# Patient Record
Sex: Female | Born: 1960 | Race: White | Hispanic: No | Marital: Married | Smoking: Former smoker
Health system: Southern US, Community
[De-identification: ages and names within clinical notes are randomized; demographics above are authoritative.]

## PROBLEM LIST (undated history)

## (undated) HISTORY — PX: TUBAL LIGATION: SHX77

---

## 2012-05-30 ENCOUNTER — Ambulatory Visit: Payer: Self-pay | Admitting: Family Medicine

## 2012-05-30 LAB — HM PAP SMEAR

## 2013-05-23 ENCOUNTER — Ambulatory Visit: Payer: Self-pay | Admitting: Family Medicine

## 2013-05-29 ENCOUNTER — Ambulatory Visit: Payer: Self-pay | Admitting: Family Medicine

## 2013-11-19 ENCOUNTER — Ambulatory Visit: Payer: Self-pay | Admitting: Family Medicine

## 2014-05-29 ENCOUNTER — Ambulatory Visit: Payer: Self-pay | Admitting: Gastroenterology

## 2014-05-29 LAB — HM COLONOSCOPY

## 2014-09-05 IMAGING — CT CT CHEST W/O CM
2 of 3 series · 15 of 36 positions shown, 18 images · non-contrast
Comparison: 05/29/2013

CLINICAL DATA: Followup of pulmonary lesions.

EXAM:
CT CHEST WITHOUT CONTRAST
TECHNIQUE: Multidetector CT imaging of the chest was performed following the
standard protocol without IV contrast.

[Series 2: routine chest wo · axial · 0.67mm/px · z∈[-550,-300]mm · 12 of 60 slices shown, 15 images]
[im 5/60  mediastinal]
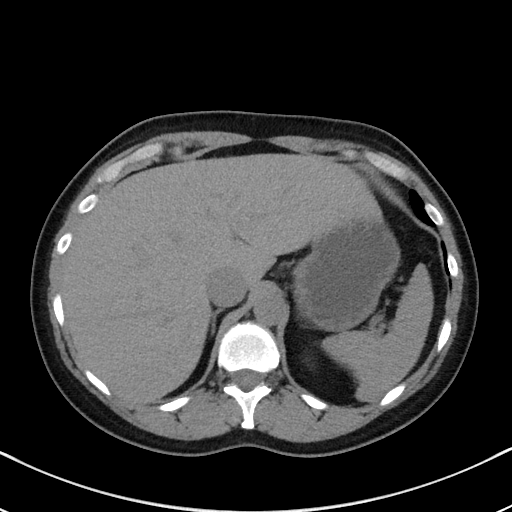
[im 5/60  lung]
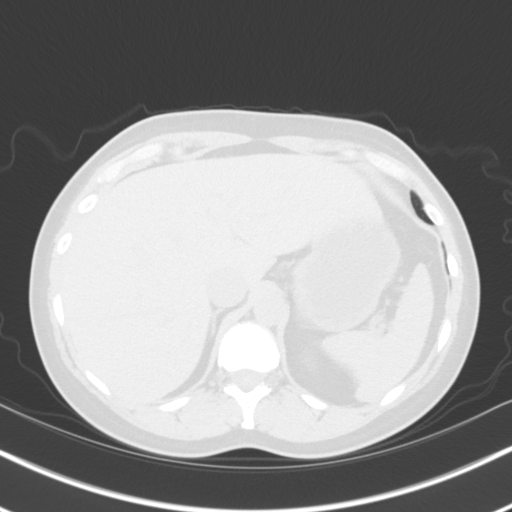
[im 9/60  lung]
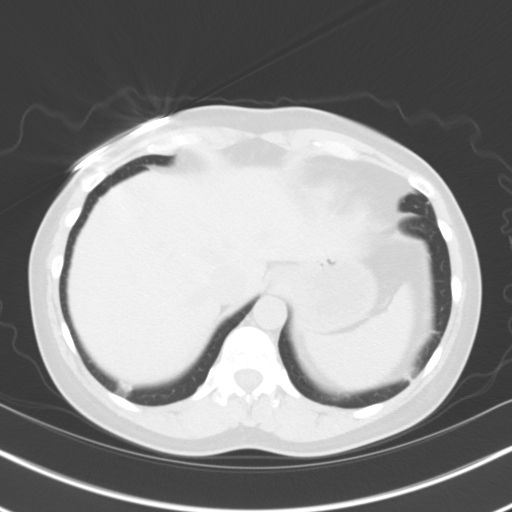
[im 14/60  lung]
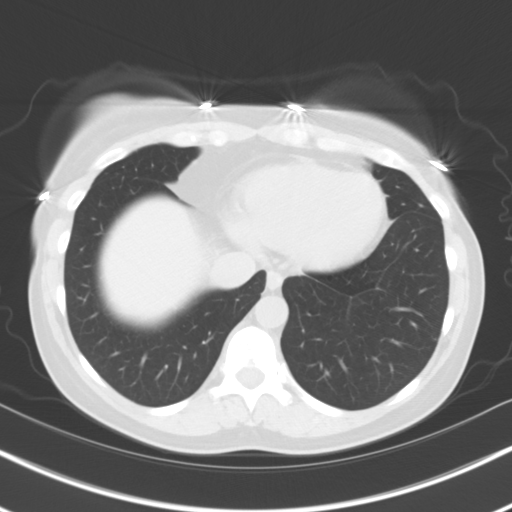
[im 18/60  lung]
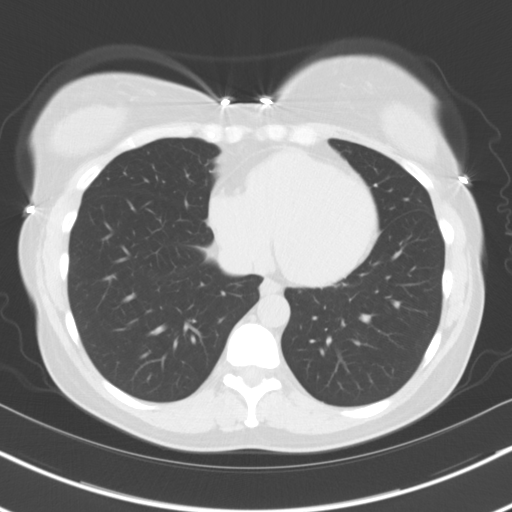
[im 22/60  mediastinal]
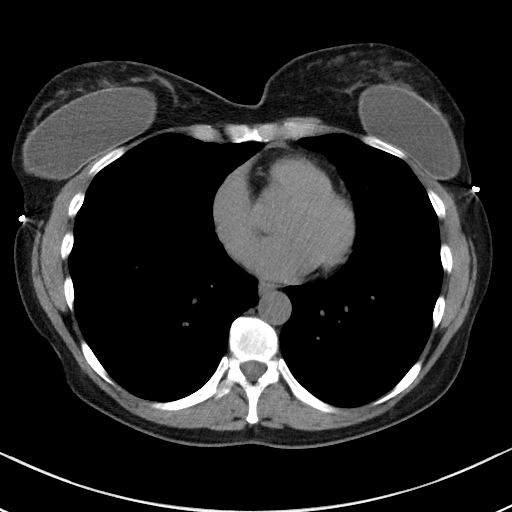
[im 22/60  lung]
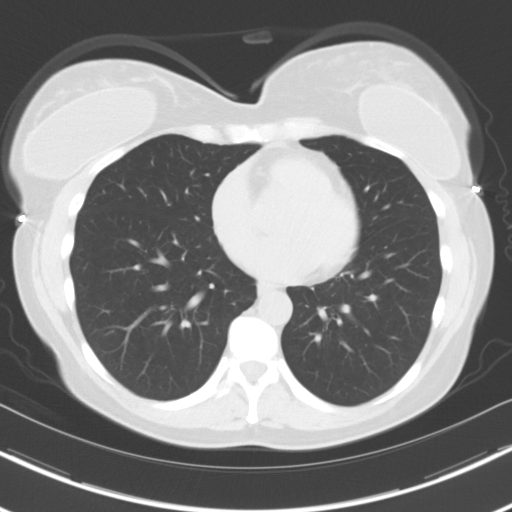
[im 27/60  lung]
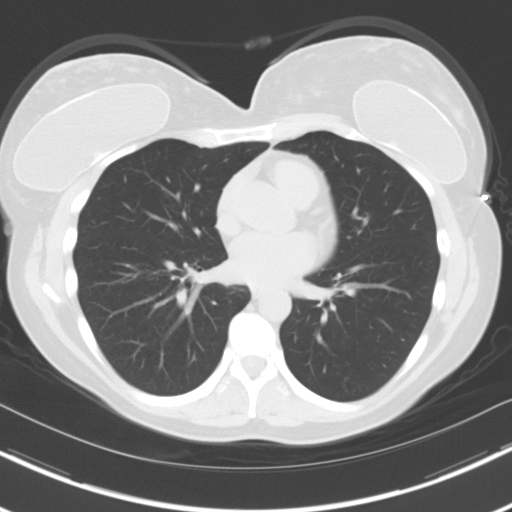
[im 33/60  lung]
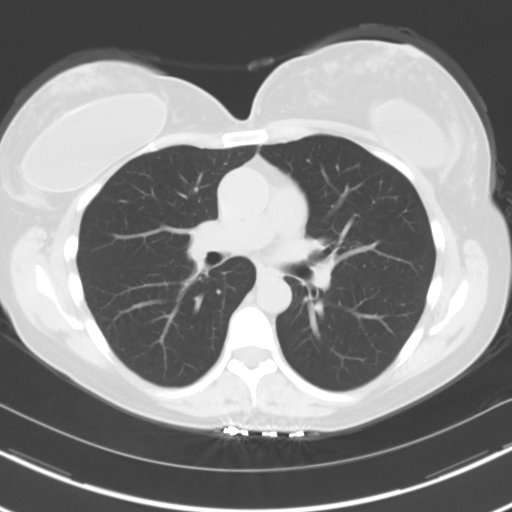
[im 38/60  lung]
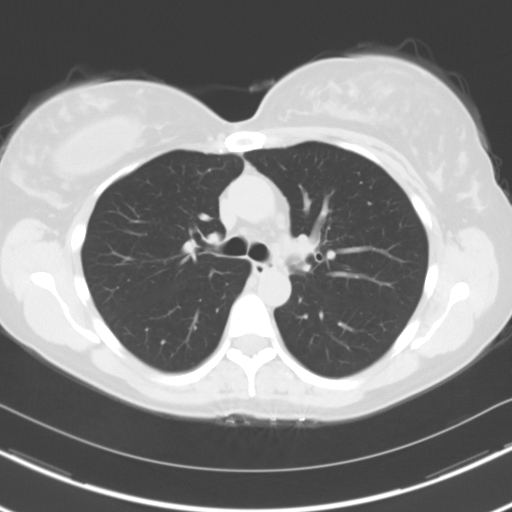
[im 42/60  mediastinal]
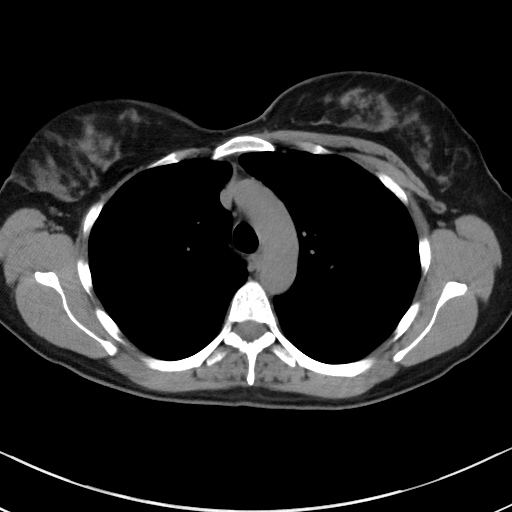
[im 42/60  lung]
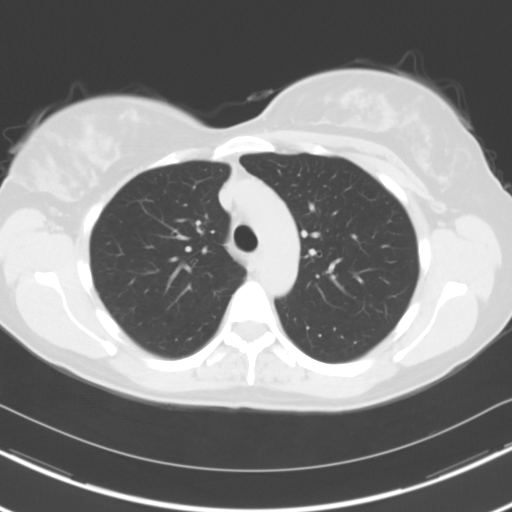
[im 46/60  lung]
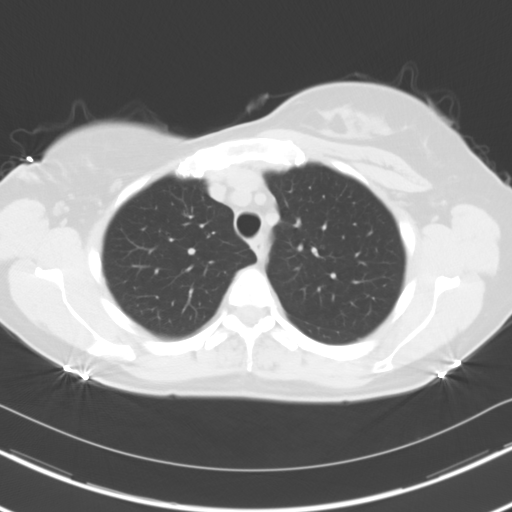
[im 51/60  lung]
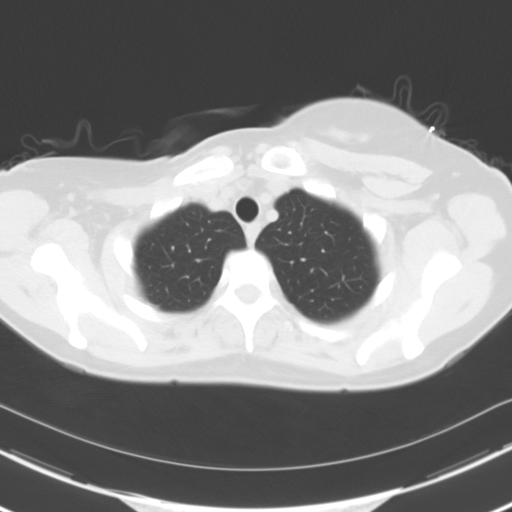
[im 55/60  lung]
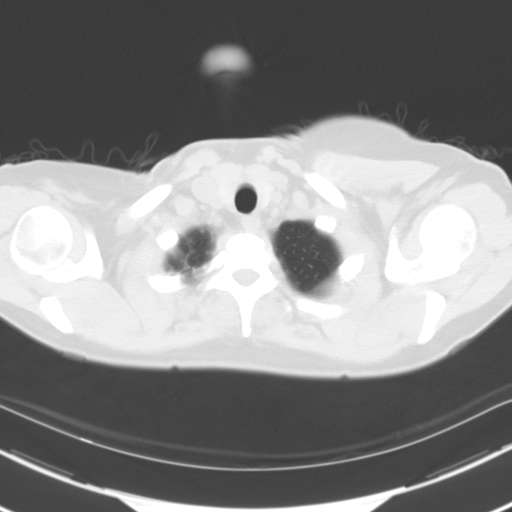

[Series 5: cor routine chest wo · coronal · 0.59mm/px · 3 of 144 slices shown]
[im 29/144  lung]
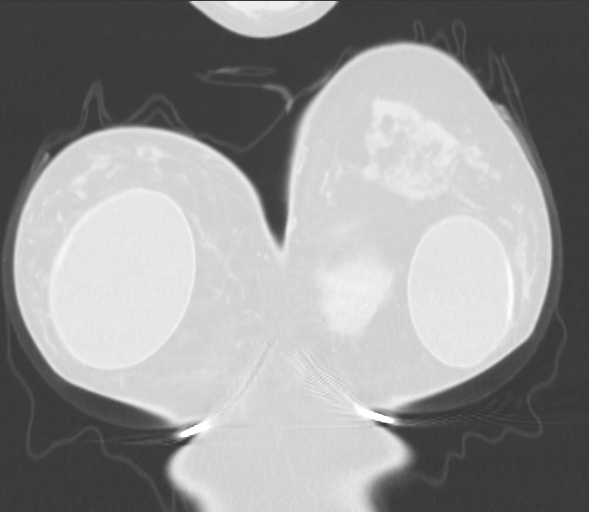
[im 58/144  lung]
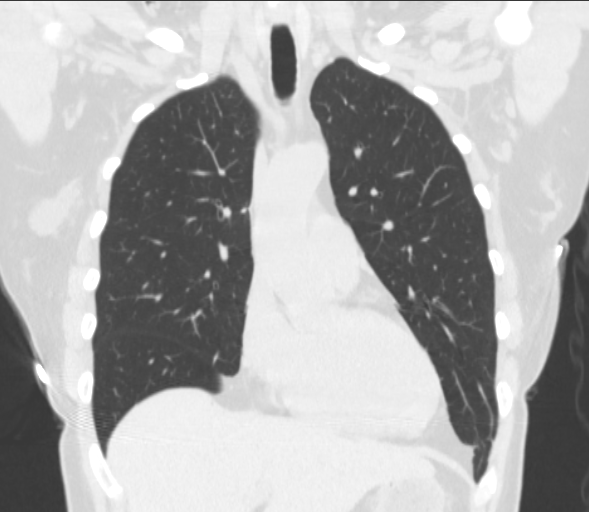
[im 86/144  lung]
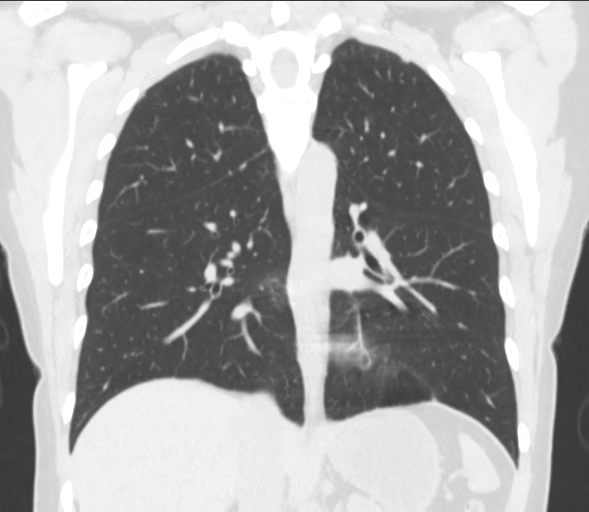

[15 of 36 positions shown; findings below may reference images not displayed]

FINDINGS: Lungs/Pleura: Resolution of the previously described right lower
lobe lung nodule. Minimal scarring remains at this site on image
53/series 4.

The left lung base nodule has also resolved with minimal scarring
remaining, including on image 52/series 4.

No new or enlarging nodules.

No pleural fluid.

Heart/Mediastinum: Bilateral breast implants. Heart size upper
normal, without pericardial effusion. No mediastinal or definite
hilar adenopathy, given limitations of unenhanced CT.

Upper Abdomen:  No significant findings.

Bones/Musculoskeletal:  No acute osseous abnormality.
IMPRESSION: 1. Interval resolution of bibasilar pulmonary opacities, which were
likely infectious or inflammatory.
2.  No acute process in the chest.

## 2015-04-14 ENCOUNTER — Telehealth: Payer: Self-pay

## 2015-04-14 NOTE — Telephone Encounter (Signed)
Patient called wanted to know the results for her cholesterol and last wellness exam. Informed patient last wellness was 0/18/15 also wanted to know las BP and last glucose and cholesterol. Patient is aware that is due for a wellness. Per patient prefers a female and will call to schedule.  Thanks,  -Kristi Shepherd

## 2015-06-30 ENCOUNTER — Encounter: Payer: Self-pay | Admitting: Family Medicine

## 2015-08-03 ENCOUNTER — Telehealth: Payer: Self-pay | Admitting: Family Medicine

## 2015-08-03 NOTE — Telephone Encounter (Signed)
Will need examination of sinuses before prescribing an antibiotic. May try Mucinex-D or Allegra-D with Flonase nasal spray (OTC medications) for symptom and inflammation relief, if she can't see someone here or in an Urgent Care Clinic.

## 2015-08-03 NOTE — Telephone Encounter (Signed)
Pt thinks she has a sinus infection.  Does not want to come in.  Hasn't been in since March 2016.  She wants an antibiotic called in.  Her call back is   [phone]  She will not be available from 2 to 3. Pm  Thanks Barth Kirkseri

## 2015-08-03 NOTE — Telephone Encounter (Signed)
Unable to contact patient. Before I call again, would you be willing to send in Rx for a sinus infection? Please advise. Thanks!

## 2015-08-06 NOTE — Telephone Encounter (Signed)
Unable to contact patient. Phone kept ringing. Will try again later.

## 2015-08-09 NOTE — Telephone Encounter (Signed)
Attempted to contact patient. No answer nor voicemail.  

## 2016-06-05 ENCOUNTER — Encounter: Payer: Self-pay | Admitting: Physician Assistant

## 2016-06-05 ENCOUNTER — Ambulatory Visit (INDEPENDENT_AMBULATORY_CARE_PROVIDER_SITE_OTHER): Payer: BC Managed Care – PPO | Admitting: Physician Assistant

## 2016-06-05 ENCOUNTER — Other Ambulatory Visit: Payer: Self-pay

## 2016-06-05 VITALS — BP 118/70 | HR 71 | Temp 98.1°F | Resp 16 | Wt 149.8 lb

## 2016-06-05 DIAGNOSIS — Z9882 Breast implant status: Secondary | ICD-10-CM | POA: Insufficient documentation

## 2016-06-05 DIAGNOSIS — F419 Anxiety disorder, unspecified: Secondary | ICD-10-CM | POA: Insufficient documentation

## 2016-06-05 DIAGNOSIS — Z780 Asymptomatic menopausal state: Secondary | ICD-10-CM | POA: Insufficient documentation

## 2016-06-05 DIAGNOSIS — J019 Acute sinusitis, unspecified: Secondary | ICD-10-CM | POA: Diagnosis not present

## 2016-06-05 DIAGNOSIS — F439 Reaction to severe stress, unspecified: Secondary | ICD-10-CM | POA: Insufficient documentation

## 2016-06-05 DIAGNOSIS — R918 Other nonspecific abnormal finding of lung field: Secondary | ICD-10-CM | POA: Insufficient documentation

## 2016-06-05 DIAGNOSIS — N83209 Unspecified ovarian cyst, unspecified side: Secondary | ICD-10-CM | POA: Insufficient documentation

## 2016-06-05 DIAGNOSIS — IMO0002 Reserved for concepts with insufficient information to code with codable children: Secondary | ICD-10-CM | POA: Insufficient documentation

## 2016-06-05 DIAGNOSIS — G4700 Insomnia, unspecified: Secondary | ICD-10-CM | POA: Insufficient documentation

## 2016-06-05 DIAGNOSIS — Z87440 Personal history of urinary (tract) infections: Secondary | ICD-10-CM | POA: Insufficient documentation

## 2016-06-05 MED ORDER — AMOXICILLIN-POT CLAVULANATE 875-125 MG PO TABS: 1.0000 | ORAL_TABLET | Freq: Two times a day (BID) | ORAL | 0 refills | Status: DC

## 2016-06-05 NOTE — Progress Notes (Signed)
Patient: Kristi AdamSharon H Shepherd Female    DOB: Oct 26, 1960   55 y.o.   MRN: [id number] Visit Date: 06/05/2016  Today's Provider: Trey SailorsAdriana M Pollak, PA-C   Chief Complaint  Patient presents with  . Sinusitis   Subjective:    Sinusitis  This is a new problem. The current episode started 1 to 4 weeks ago. The problem has been gradually worsening since onset. There has been no fever. Associated symptoms include congestion, coughing, headaches and sinus pressure. Pertinent negatives include no shortness of breath or sore throat. (Phlegm-green to yellowish) Past treatments include oral decongestants (Advil Cold Sinus last week). The treatment provided no relief.   Total length of symptoms is 10 days. Felt like she was getting better then got worse. Advil cold sinus provided some relief. Patient has had these symptoms before.    Allergies  Allergen Reactions  . Codeine     Other reaction(s): Itching     Current Outpatient Prescriptions:  .  JUNEL FE 1/20 1-20 MG-MCG tablet, , Disp: , Rfl:  .  MULTIPLE VITAMIN PO, Take by mouth., Disp: , Rfl:   Review of Systems  HENT: Positive for congestion and sinus pressure. Negative for sore throat.   Respiratory: Positive for cough. Negative for shortness of breath.   Neurological: Positive for headaches.    Social History  Substance Use Topics  . Smoking status: Former Games developermoker  . Smokeless tobacco: Never Used     Comment: Quit at age 55  . Alcohol use Yes     Comment: Ocassional alcohol use   Objective:   BP 118/70 (BP Location: Left Arm, Patient Position: Sitting, Cuff Size: Normal)   Pulse 71   Temp 98.1 F (36.7 C) (Oral)   Resp 16   Wt 149 lb 12.8 oz (67.9 kg)   LMP 01/22/2016   SpO2 98%   Physical Exam  Constitutional: She is oriented to person, place, and time. She appears well-developed and well-nourished. No distress.  HENT:  Right Ear: External ear normal.  Left Ear: External ear normal.  Nose: Right sinus exhibits  no maxillary sinus tenderness and no frontal sinus tenderness. Left sinus exhibits no maxillary sinus tenderness and no frontal sinus tenderness.  Mouth/Throat: Oropharynx is clear and moist. No oropharyngeal exudate, posterior oropharyngeal edema or posterior oropharyngeal erythema.  Tms opaque bilaterally   Eyes: Conjunctivae are normal. Right eye exhibits no discharge. Left eye exhibits no discharge.  Neck: Neck supple.  Cardiovascular: Normal rate and regular rhythm.   Pulmonary/Chest: Effort normal and breath sounds normal.  Lymphadenopathy:    She has cervical adenopathy.  Neurological: She is alert and oriented to person, place, and time.  Skin: Skin is warm and dry. She is not diaphoretic.  Psychiatric: She has a normal mood and affect. Her behavior is normal.        Assessment & Plan:      Problem List Items Addressed This Visit    None    Visit Diagnoses    Acute non-recurrent sinusitis, unspecified location    -  Primary   Relevant Medications   amoxicillin-clavulanate (AUGMENTIN) 875-125 MG tablet     Patient is 55 y/o female presenting with sinusitis. Will treat as above. Patient will call back if not feeling better.    Patient Instructions  Sinusitis, Adult Sinusitis is redness, soreness, and inflammation of the paranasal sinuses. Paranasal sinuses are air pockets within the bones of your face. They are located beneath your eyes,  in the middle of your forehead, and above your eyes. In healthy paranasal sinuses, mucus is able to drain out, and air is able to circulate through them by way of your nose. However, when your paranasal sinuses are inflamed, mucus and air can become trapped. This can allow bacteria and other germs to grow and cause infection. Sinusitis can develop quickly and last only a short time (acute) or continue over a long period (chronic). Sinusitis that lasts for more than 12 weeks is considered chronic. CAUSES Causes of sinusitis  include:  Allergies.  Structural abnormalities, such as displacement of the cartilage that separates your nostrils (deviated septum), which can decrease the air flow through your nose and sinuses and affect sinus drainage.  Functional abnormalities, such as when the small hairs (cilia) that line your sinuses and help remove mucus do not work properly or are not present. SIGNS AND SYMPTOMS Symptoms of acute and chronic sinusitis are the same. The primary symptoms are pain and pressure around the affected sinuses. Other symptoms include:  Upper toothache.  Earache.  Headache.  Bad breath.  Decreased sense of smell and taste.  A cough, which worsens when you are lying flat.  Fatigue.  Fever.  Thick drainage from your nose, which often is green and may contain pus (purulent).  Swelling and warmth over the affected sinuses. DIAGNOSIS Your health care provider will perform a physical exam. During your exam, your health care provider may perform any of the following to help determine if you have acute sinusitis or chronic sinusitis:  Look in your nose for signs of abnormal growths in your nostrils (nasal polyps).  Tap over the affected sinus to check for signs of infection.  View the inside of your sinuses using an imaging device that has a light attached (endoscope). If your health care provider suspects that you have chronic sinusitis, one or more of the following tests may be recommended:  Allergy tests.  Nasal culture. A sample of mucus is taken from your nose, sent to a lab, and screened for bacteria.  Nasal cytology. A sample of mucus is taken from your nose and examined by your health care provider to determine if your sinusitis is related to an allergy. TREATMENT Most cases of acute sinusitis are related to a viral infection and will resolve on their own within 10 days. Sometimes, medicines are prescribed to help relieve symptoms of both acute and chronic sinusitis.  These may include pain medicines, decongestants, nasal steroid sprays, or saline sprays. However, for sinusitis related to a bacterial infection, your health care provider will prescribe antibiotic medicines. These are medicines that will help kill the bacteria causing the infection. Rarely, sinusitis is caused by a fungal infection. In these cases, your health care provider will prescribe antifungal medicine. For some cases of chronic sinusitis, surgery is needed. Generally, these are cases in which sinusitis recurs more than 3 times per year, despite other treatments. HOME CARE INSTRUCTIONS  Drink plenty of water. Water helps thin the mucus so your sinuses can drain more easily.  Use a humidifier.  Inhale steam 3-4 times a day (for example, sit in the bathroom with the shower running).  Apply a warm, moist washcloth to your face 3-4 times a day, or as directed by your health care provider.  Use saline nasal sprays to help moisten and clean your sinuses.  Take medicines only as directed by your health care provider.  If you were prescribed either an antibiotic or antifungal medicine, finish  it all even if you start to feel better. SEEK IMMEDIATE MEDICAL CARE IF:  You have increasing pain or severe headaches.  You have nausea, vomiting, or drowsiness.  You have swelling around your face.  You have vision problems.  You have a stiff neck.  You have difficulty breathing.   This information is not intended to replace advice given to you by your health care provider. Make sure you discuss any questions you have with your health care provider.   Document Released: 07/10/2005 Document Revised: 07/31/2014 Document Reviewed: 07/25/2011 Elsevier Interactive Patient Education 2016 [employer].    No Follow-up on file.  The entirety of the information documented in the History of Present Illness, Review of Systems and Physical Exam were personally obtained by me. Portions of this  information were initially documented by Miracle Mongillo and reviewed by me for thoroughness and accuracy.            Trey Sailors, PA-C  [facility]

## 2016-06-05 NOTE — Patient Instructions (Signed)

## 2016-06-14 ENCOUNTER — Ambulatory Visit (INDEPENDENT_AMBULATORY_CARE_PROVIDER_SITE_OTHER): Payer: BC Managed Care – PPO | Admitting: Physician Assistant

## 2016-06-14 ENCOUNTER — Encounter: Payer: Self-pay | Admitting: Physician Assistant

## 2016-06-14 VITALS — BP 122/72 | HR 62 | Temp 98.1°F | Resp 16 | Ht 65.0 in | Wt 150.0 lb

## 2016-06-14 DIAGNOSIS — Z0000 Encounter for general adult medical examination without abnormal findings: Secondary | ICD-10-CM | POA: Diagnosis not present

## 2016-06-14 DIAGNOSIS — R918 Other nonspecific abnormal finding of lung field: Secondary | ICD-10-CM | POA: Diagnosis not present

## 2016-06-14 NOTE — Patient Instructions (Signed)

## 2016-06-14 NOTE — Progress Notes (Signed)
Patient: Kristi Shepherd, Female    DOB: [birth year], 55 y.o.   MRN: [id number] Visit Date: 06/14/2016  Today's Provider: Trinna Post, PA-C   Chief Complaint  Patient presents with  . Annual Exam   Subjective:    Annual physical exam Kristi Shepherd is a 55 y.o. female who presents today for health maintenance and complete physical. She feels well. She reports exercising about 10 minutes a day, walking. She reports she is sleeping poorly. She was seen in clinic on 06/05/2016 for sinusitis and is finishing a course of Augmentin. She reports feeling much better.   She haas worked at [employer] for the past 30 years. She is retiring this December and is planning on working part time closer to home in [place].  She is married and has three children, a son who is 68, a daughter who is 14, and her younger son who is 109. She had a tubal ligation after her youngest child. Her youngest son has special needs and is starting [place] here at [facility], so this is another reason she would like to be closer to home.  She sees Dr. Hester Mates [facility] 2/2 a history of HPV+ in 2013 and irregular vaginal bleeding. She reports her most recent Pap/HPV 05/24/2016 were both negative. She takes Junel for her irregular bleeding/HRT and is seen regularly by her OBGYN for this. She reports her gynecologist performs her breast exam and rectal exam. She gets her mammograms at [facility] and reports she had one in 2016.   She has a history of smoking, but quit at 89. She did demonstrate some lung nodules on a 2014 CT scan that resolved on follow up Ct.  Her last colonoscopy was in 2015 with Dr. Allen Norris. It showed internal hemorrhoids but was otherwise normal. She is due for her next colonoscopy in 2025, pending no new symptoms.  She denies any family history of colon cancer, breast cancer, lung cancer. Her father does currently have CLL and her mother has thyroid  problems.  ----------------------------------------------------------------- Colonoscopy- 05/29/14 internal hemorrhoids, repeat 10 years Pap- 05/30/12 normal, HPV positive refer to GYN Mammogram- 05/28/14- normal has appt for another one already.  Immunization History  Administered Date(s) Administered  . Tdap 04/14/2008     Review of Systems  Constitutional: Negative.   HENT: Negative.   Eyes: Negative.   Respiratory: Negative.   Cardiovascular: Negative.   Gastrointestinal: Negative.   Endocrine: Negative.   Genitourinary: Negative.   Musculoskeletal: Negative.   Skin: Negative.   Allergic/Immunologic: Negative.   Neurological: Negative.   Hematological: Negative.   Psychiatric/Behavioral: Negative.     Social History      She  reports that she has quit smoking. She has never used smokeless tobacco. She reports that she drinks alcohol. She reports that she does not use drugs.       Social History   Social History  . Marital status: Married    Spouse name: N/A  . Number of children: N/A  . Years of education: N/A   Social History Main Topics  . Smoking status: Former Research scientist (life sciences)  . Smokeless tobacco: Never Used     Comment: Quit at age 59  . Alcohol use Yes     Comment: Ocassional alcohol use  . Drug use: No  . Sexual activity: Not Asked   Other Topics Concern  . None   Social History Narrative  . None  History reviewed. No pertinent past medical history.   Patient Active Problem List   Diagnosis Date Noted  . Anxiety 06/05/2016  . Coitalgia 06/05/2016  . Status post bilateral breast implants 06/05/2016  . History of urinary tract infection 06/05/2016  . Cannot sleep 06/05/2016  . Asymptomatic postmenopausal status 06/05/2016  . Cyst of ovary 06/05/2016  . Lung nodule, multiple 06/05/2016  . Feeling stressed out 06/05/2016    Past Surgical History:  Procedure Laterality Date  . TUBAL LIGATION      Family History        Family Status   Relation Status  . Mother Alive  . Father Alive  . Maternal Grandmother Deceased   died from old age  . Maternal Grandfather Deceased   cause of death Alzheimers  . Paternal Grandmother Deceased at age 32   Brain Tumot  . Paternal Grandfather Deceased        Her family history includes Leukemia in her father; Thyroid disease in her mother.     Allergies  Allergen Reactions  . Codeine     Other reaction(s): Itching     Current Outpatient Prescriptions:  Marland Kitchen  MULTIPLE VITAMIN PO, Take by mouth., Disp: , Rfl:  .  JUNEL FE 1/20 1-20 MG-MCG tablet, , Disp: , Rfl:    Patient Care Team: Trinna Post, PA-C as PCP - General (Physician Assistant)      Objective:   Vitals: BP 122/72 (BP Location: Left Arm, Patient Position: Sitting, Cuff Size: Normal)   Pulse 62   Temp 98.1 F (36.7 C) (Oral)   Resp 16   Ht 5' 5" (1.651 m)   Wt 150 lb (68 kg)   LMP 01/22/2016   BMI 24.96 kg/m    Physical Exam  Constitutional: She is oriented to person, place, and time. She appears well-developed and well-nourished. No distress.  HENT:  Right Ear: External ear normal.  Left Ear: External ear normal.  Mouth/Throat: Oropharynx is clear and moist. No oropharyngeal exudate.  Eyes: Conjunctivae are normal. Pupils are equal, round, and reactive to light. Right eye exhibits no discharge. Left eye exhibits no discharge.  Neck: Neck supple. No thyromegaly present.  Cardiovascular: Normal rate, regular rhythm and normal heart sounds.   Pulmonary/Chest: Effort normal and breath sounds normal.  Abdominal: Soft. Bowel sounds are normal.  Lymphadenopathy:    She has no cervical adenopathy.  Neurological: She is alert and oriented to person, place, and time.  Skin: Skin is warm and dry. She is not diaphoretic.  Psychiatric: She has a normal mood and affect. Her behavior is normal.     Depression Screen No flowsheet data found.    Assessment & Plan:     Routine Health Maintenance and  Physical Exam  Exercise Activities and Dietary recommendations Goals    None      Immunization History  Administered Date(s) Administered  . Tdap 04/14/2008    Health Maintenance  Topic Date Due  . Hepatitis C Screening  September 09, 1960  . HIV Screening  06/03/1977  . TETANUS/TDAP  06/03/1981  . PAP SMEAR  06/04/1983  . COLONOSCOPY  06/03/2012  . MAMMOGRAM  05/28/2016  . INFLUENZA VACCINE  06/14/2017 (Originally 02/22/2016)     Discussed health benefits of physical activity, and encouraged her to engage in regular exercise appropriate for her age and condition.    Problem List Items Addressed This Visit      Other   Lung nodule, multiple    Lung nodule resolved  on CT scan lung 2015       Other Visit Diagnoses    Annual physical exam    -  Primary   Relevant Orders   CBC with Differential/Platelet   TSH   Lipid panel   Comprehensive metabolic panel     Evaluate and treat as above.  Return in about 1 year (around 06/14/2017) for annual physical exam.  The entirety of the information documented in the History of Present Illness, Review of Systems and Physical Exam were personally obtained by me. Portions of this information were initially documented by [place] and reviewed by me for thoroughness and accuracy.        Patient Instructions  Health Maintenance for Postmenopausal Women Introduction Menopause is a normal process in which your reproductive ability comes to an end. This process happens gradually over a span of months to years, usually between the ages of 23 and 16. Menopause is complete when you have missed 12 consecutive menstrual periods. It is important to talk with your health care provider about some of the most common conditions that affect postmenopausal women, such as heart disease, cancer, and bone loss (osteoporosis). Adopting a healthy lifestyle and getting preventive care can help to promote your health and wellness. Those actions can also  lower your chances of developing some of these common conditions. What should I know about menopause? During menopause, you may experience a number of symptoms, such as:  Moderate-to-severe hot flashes.  Night sweats.  Decrease in sex drive.  Mood swings.  Headaches.  Tiredness.  Irritability.  Memory problems.  Insomnia. Choosing to treat or not to treat menopausal changes is an individual decision that you make with your health care provider. What should I know about hormone replacement therapy and supplements? Hormone therapy products are effective for treating symptoms that are associated with menopause, such as hot flashes and night sweats. Hormone replacement carries certain risks, especially as you become older. If you are thinking about using estrogen or estrogen with progestin treatments, discuss the benefits and risks with your health care provider. What should I know about heart disease and stroke? Heart disease, heart attack, and stroke become more likely as you age. This may be due, in part, to the hormonal changes that your body experiences during menopause. These can affect how your body processes dietary fats, triglycerides, and cholesterol. Heart attack and stroke are both medical emergencies. There are many things that you can do to help prevent heart disease and stroke:  Have your blood pressure checked at least every 1-2 years. High blood pressure causes heart disease and increases the risk of stroke.  If you are 51-30 years old, ask your health care provider if you should take aspirin to prevent a heart attack or a stroke.  Do not use any tobacco products, including cigarettes, chewing tobacco, or electronic cigarettes. If you need help quitting, ask your health care provider.  It is important to eat a healthy diet and maintain a healthy weight.  Be sure to include plenty of vegetables, fruits, low-fat dairy products, and lean protein.  Avoid eating foods  that are high in solid fats, added sugars, or salt (sodium).  Get regular exercise. This is one of the most important things that you can do for your health.  Try to exercise for at least 150 minutes each week. The type of exercise that you do should increase your heart rate and make you sweat. This is known as moderate-intensity exercise.  Try  to do strengthening exercises at least twice each week. Do these in addition to the moderate-intensity exercise.  Know your numbers.Ask your health care provider to check your cholesterol and your blood glucose. Continue to have your blood tested as directed by your health care provider. What should I know about cancer screening? There are several types of cancer. Take the following steps to reduce your risk and to catch any cancer development as early as possible. Breast Cancer  Practice breast self-awareness.  This means understanding how your breasts normally appear and feel.  It also means doing regular breast self-exams. Let your health care provider know about any changes, no matter how small.  If you are 71 or older, have a clinician do a breast exam (clinical breast exam or CBE) every year. Depending on your age, family history, and medical history, it may be recommended that you also have a yearly breast X-ray (mammogram).  If you have a family history of breast cancer, talk with your health care provider about genetic screening.  If you are at high risk for breast cancer, talk with your health care provider about having an MRI and a mammogram every year.  Breast cancer (BRCA) gene test is recommended for women who have family members with BRCA-related cancers. Results of the assessment will determine the need for genetic counseling and BRCA1 and for BRCA2 testing. BRCA-related cancers include these types:  Breast. This occurs in males or females.  Ovarian.  Tubal. This may also be called fallopian tube cancer.  Cancer of the  abdominal or pelvic lining (peritoneal cancer).  Prostate.  Pancreatic. Cervical, Uterine, and Ovarian Cancer  Your health care provider may recommend that you be screened regularly for cancer of the pelvic organs. These include your ovaries, uterus, and vagina. This screening involves a pelvic exam, which includes checking for microscopic changes to the surface of your cervix (Pap test).  For women ages 21-65, health care providers may recommend a pelvic exam and a Pap test every three years. For women ages 28-65, they may recommend the Pap test and pelvic exam, combined with testing for human papilloma virus (HPV), every five years. Some types of HPV increase your risk of cervical cancer. Testing for HPV may also be done on women of any age who have unclear Pap test results.  Other health care providers may not recommend any screening for nonpregnant women who are considered low risk for pelvic cancer and have no symptoms. Ask your health care provider if a screening pelvic exam is right for you.  If you have had past treatment for cervical cancer or a condition that could lead to cancer, you need Pap tests and screening for cancer for at least 20 years after your treatment. If Pap tests have been discontinued for you, your risk factors (such as having a new sexual partner) need to be reassessed to determine if you should start having screenings again. Some women have medical problems that increase the chance of getting cervical cancer. In these cases, your health care provider may recommend that you have screening and Pap tests more often.  If you have a family history of uterine cancer or ovarian cancer, talk with your health care provider about genetic screening.  If you have vaginal bleeding after reaching menopause, tell your health care provider.  There are currently no reliable tests available to screen for ovarian cancer. Lung Cancer  Lung cancer screening is recommended for adults  2-48 years old who are at high  risk for lung cancer because of a history of smoking. A yearly low-dose CT scan of the lungs is recommended if you:  Currently smoke.  Have a history of at least 30 pack-years of smoking and you currently smoke or have quit within the past 15 years. A pack-year is smoking an average of one pack of cigarettes per day for one year. Yearly screening should:  Continue until it has been 15 years since you quit.  Stop if you develop a health problem that would prevent you from having lung cancer treatment. Colorectal Cancer  This type of cancer can be detected and can often be prevented.  Routine colorectal cancer screening usually begins at age 56 and continues through age 51.  If you have risk factors for colon cancer, your health care provider may recommend that you be screened at an earlier age.  If you have a family history of colorectal cancer, talk with your health care provider about genetic screening.  Your health care provider may also recommend using home test kits to check for hidden blood in your stool.  A small camera at the end of a tube can be used to examine your colon directly (sigmoidoscopy or colonoscopy). This is done to check for the earliest forms of colorectal cancer.  Direct examination of the colon should be repeated every 5-10 years until age 29. However, if early forms of precancerous polyps or small growths are found or if you have a family history or genetic risk for colorectal cancer, you may need to be screened more often. Skin Cancer  Check your skin from head to toe regularly.  Monitor any moles. Be sure to tell your health care provider:  About any new moles or changes in moles, especially if there is a change in a mole's shape or color.  If you have a mole that is larger than the size of a pencil eraser.  If any of your family members has a history of skin cancer, especially at a young age, talk with your health care  provider about genetic screening.  Always use sunscreen. Apply sunscreen liberally and repeatedly throughout the day.  Whenever you are outside, protect yourself by wearing long sleeves, pants, a wide-brimmed hat, and sunglasses. What should I know about osteoporosis? Osteoporosis is a condition in which bone destruction happens more quickly than new bone creation. After menopause, you may be at an increased risk for osteoporosis. To help prevent osteoporosis or the bone fractures that can happen because of osteoporosis, the following is recommended:  If you are 74-54 years old, get at least 1,000 mg of calcium and at least 600 mg of vitamin D per day.  If you are older than age 60 but younger than age 86, get at least 1,200 mg of calcium and at least 600 mg of vitamin D per day.  If you are older than age 41, get at least 1,200 mg of calcium and at least 800 mg of vitamin D per day. Smoking and excessive alcohol intake increase the risk of osteoporosis. Eat foods that are rich in calcium and vitamin D, and do weight-bearing exercises several times each week as directed by your health care provider. What should I know about how menopause affects my mental health? Depression may occur at any age, but it is more common as you become older. Common symptoms of depression include:  Low or sad mood.  Changes in sleep patterns.  Changes in appetite or eating patterns.  Feeling an  overall lack of motivation or enjoyment of activities that you previously enjoyed.  Frequent crying spells. Talk with your health care provider if you think that you are experiencing depression. What should I know about immunizations? It is important that you get and maintain your immunizations. These include:  Tetanus, diphtheria, and pertussis (Tdap) booster vaccine.  Influenza every year before the flu season begins.  Pneumonia vaccine.  Shingles vaccine. Your health care provider may also recommend other  immunizations. This information is not intended to replace advice given to you by your health care provider. Make sure you discuss any questions you have with your health care provider. Document Released: 09/01/2005 Document Revised: 01/28/2016 Document Reviewed: 04/13/2015  2017 Elsevier    --------------------------------------------------------------------    Trinna Post, PA-C  [facility]

## 2016-06-14 NOTE — Assessment & Plan Note (Signed)
Lung nodule resolved on CT scan lung 2015

## 2017-01-31 ENCOUNTER — Telehealth: Payer: Self-pay | Admitting: Physician Assistant

## 2017-01-31 NOTE — Telephone Encounter (Signed)
Patient advised lab slip has been reprinted at front desk for pick up.

## 2017-01-31 NOTE — Telephone Encounter (Signed)
Pt says she was in earlier this year.  She has not had the labs requested done yet.  Can she get a new lab sheet.  She wants to know if thyroids can be ordered if not already.p

## 2017-02-03 LAB — LIPID PANEL
Chol/HDL Ratio: 2.8 ratio (ref 0.0–4.4)
Cholesterol, Total: 209 mg/dL — ABNORMAL HIGH (ref 100–199)
HDL: 75 mg/dL (ref >39)
LDL Calculated: 121 mg/dL — ABNORMAL HIGH (ref 0–99)
Triglycerides: 66 mg/dL (ref 0–149)
VLDL Cholesterol Cal: 13 mg/dL (ref 5–40)

## 2017-02-03 LAB — COMPREHENSIVE METABOLIC PANEL
ALT: 33 IU/L — ABNORMAL HIGH (ref 0–32)
AST: 24 IU/L (ref 0–40)
Albumin/Globulin Ratio: 1.7 (ref 1.2–2.2)
Albumin: 4.3 g/dL (ref 3.5–5.5)
Alkaline Phosphatase: 81 IU/L (ref 39–117)
BUN/Creatinine Ratio: 14 (ref 9–23)
BUN: 12 mg/dL (ref 6–24)
Bilirubin Total: 0.5 mg/dL (ref 0.0–1.2)
CO2: 24 mmol/L (ref 20–29)
Calcium: 9.2 mg/dL (ref 8.7–10.2)
Chloride: 103 mmol/L (ref 96–106)
Creatinine, Ser: 0.86 mg/dL (ref 0.57–1.00)
GFR calc Af Amer: 89 mL/min/{1.73_m2} (ref >59)
GFR calc non Af Amer: 77 mL/min/{1.73_m2} (ref >59)
Globulin, Total: 2.6 g/dL (ref 1.5–4.5)
Glucose: 97 mg/dL (ref 65–99)
Potassium: 4.4 mmol/L (ref 3.5–5.2)
Sodium: 140 mmol/L (ref 134–144)
Total Protein: 6.9 g/dL (ref 6.0–8.5)

## 2017-02-03 LAB — CBC WITH DIFFERENTIAL/PLATELET
Basophils Absolute: 0.1 10*3/uL (ref 0.0–0.2)
Basos: 1 %
EOS (ABSOLUTE): 0.3 10*3/uL (ref 0.0–0.4)
Eos: 4 %
Hematocrit: 40.6 % (ref 34.0–46.6)
Hemoglobin: 13.8 g/dL (ref 11.1–15.9)
Immature Grans (Abs): 0 10*3/uL (ref 0.0–0.1)
Immature Granulocytes: 0 %
Lymphocytes Absolute: 2.4 10*3/uL (ref 0.7–3.1)
Lymphs: 37 %
MCH: 31.3 pg (ref 26.6–33.0)
MCHC: 34 g/dL (ref 31.5–35.7)
MCV: 92 fL (ref 79–97)
Monocytes Absolute: 0.4 10*3/uL (ref 0.1–0.9)
Monocytes: 6 %
Neutrophils Absolute: 3.4 10*3/uL (ref 1.4–7.0)
Neutrophils: 52 %
Platelets: 199 10*3/uL (ref 150–379)
RBC: 4.41 x10E6/uL (ref 3.77–5.28)
RDW: 13.2 % (ref 12.3–15.4)
WBC: 6.6 10*3/uL (ref 3.4–10.8)

## 2017-02-03 LAB — TSH: TSH: 2.68 u[IU]/mL (ref 0.450–4.500)

## 2017-02-05 ENCOUNTER — Telehealth: Payer: Self-pay

## 2017-02-05 NOTE — Telephone Encounter (Signed)
Pt informed and voiced understanding of results. 

## 2017-02-05 NOTE — Telephone Encounter (Signed)
LMTCB 02/05/2017  Thanks,   -Vernona RiegerLaura

## 2017-02-05 NOTE — Telephone Encounter (Signed)
-----   Message from Trey SailorsAdriana M Pollak, [place] JerseyPA-C sent at 02/03/2017  9:07 AM EDT ----- Lab results all normal.

## 2017-03-09 ENCOUNTER — Encounter: Payer: Self-pay | Admitting: Physician Assistant

## 2017-04-19 ENCOUNTER — Encounter: Payer: Self-pay | Admitting: Physician Assistant

## 2017-06-08 LAB — HM PAP SMEAR: HM PAP: NORMAL

## 2017-06-29 LAB — HM MAMMOGRAPHY

## 2018-02-14 ENCOUNTER — Ambulatory Visit (INDEPENDENT_AMBULATORY_CARE_PROVIDER_SITE_OTHER): Payer: BC Managed Care – PPO | Admitting: Physician Assistant

## 2018-02-14 ENCOUNTER — Encounter: Payer: Self-pay | Admitting: Physician Assistant

## 2018-02-14 VITALS — BP 112/78 | HR 64 | Temp 98.7°F | Resp 16 | Ht 65.5 in | Wt 156.0 lb

## 2018-02-14 DIAGNOSIS — M89319 Hypertrophy of bone, unspecified shoulder: Secondary | ICD-10-CM

## 2018-02-14 DIAGNOSIS — Z13220 Encounter for screening for lipoid disorders: Secondary | ICD-10-CM

## 2018-02-14 DIAGNOSIS — R221 Localized swelling, mass and lump, neck: Secondary | ICD-10-CM | POA: Diagnosis not present

## 2018-02-14 DIAGNOSIS — R222 Localized swelling, mass and lump, trunk: Secondary | ICD-10-CM | POA: Diagnosis not present

## 2018-02-14 DIAGNOSIS — Z130 Encounter for screening for diseases of the blood and blood-forming organs and certain disorders involving the immune mechanism: Secondary | ICD-10-CM

## 2018-02-14 DIAGNOSIS — Z0000 Encounter for general adult medical examination without abnormal findings: Secondary | ICD-10-CM

## 2018-02-14 DIAGNOSIS — Z0001 Encounter for general adult medical examination with abnormal findings: Secondary | ICD-10-CM | POA: Diagnosis not present

## 2018-02-14 DIAGNOSIS — Z1329 Encounter for screening for other suspected endocrine disorder: Secondary | ICD-10-CM

## 2018-02-14 DIAGNOSIS — Z131 Encounter for screening for diabetes mellitus: Secondary | ICD-10-CM

## 2018-02-14 NOTE — Patient Instructions (Signed)
Lymphadenopathy Lymphadenopathy refers to swollen or enlarged lymph glands, also called lymph nodes. Lymph glands are part of your body's defense (immune) system, which protects the body from infections, germs, and diseases. Lymph glands are found in many locations in your body, including the neck, underarm, and groin. Many things can cause lymph glands to become enlarged. When your immune system responds to germs, such as viruses or bacteria, infection-fighting cells and fluid build up. This causes the glands to grow in size. Usually, this is not something to worry about. The swelling and any soreness often go away without treatment. However, swollen lymph glands can also be caused by a number of diseases. Your health care provider may do various tests to help determine the cause. If the cause of your swollen lymph glands cannot be found, it is important to monitor your condition to make sure the swelling goes away. Follow these instructions at home: Watch your condition for any changes. The following actions may help to lessen any discomfort you are feeling:  Get plenty of rest.  Take medicines only as directed by your health care provider. Your health care provider may recommend over-the-counter medicines for pain.  Apply moist heat compresses to the site of swollen lymph nodes as directed by your health care provider. This can help reduce any pain.  Check your lymph nodes daily for any changes.  Keep all follow-up visits as directed by your health care provider. This is important.  Contact a health care provider if:  Your lymph nodes are still swollen after 2 weeks.  Your swelling increases or spreads to other areas.  Your lymph nodes are hard, seem fixed to the skin, or are growing rapidly.  Your skin over the lymph nodes is red and inflamed.  You have a fever.  You have chills.  You have fatigue.  You develop a sore throat.  You have abdominal pain.  You have weight  loss.  You have night sweats. Get help right away if:  You notice fluid leaking from the area of the enlarged lymph node.  You have severe pain in any area of your body.  You have chest pain.  You have shortness of breath. This information is not intended to replace advice given to you by your health care provider. Make sure you discuss any questions you have with your health care provider. Document Released: 04/18/2008 Document Revised: 12/16/2015 Document Reviewed: 02/12/2014 Elsevier Interactive Patient Education  2018 Elsevier Inc.  

## 2018-02-14 NOTE — Progress Notes (Signed)
Patient: Kristi Shepherd, Female    DOB: Feb 26, 1961, 57 y.o.   MRN: [id number] Visit Date: 02/14/2018  Today's Provider: Trey Sailors, PA-C   Chief Complaint  Patient presents with  . Annual Exam   Subjective:    Annual physical exam Kristi Shepherd is a 57 y.o. female who presents today for health maintenance and complete physical. She feels well. She reports exercising some. She reports she is sleeping well.  She sees [facility] for her PAP smears and Mammograms. She had a PAP done in 2018 which revealed ASCUS with HPV negative and mammogram 2018 was normal. These records are available to me today, faxed from [facility].  She retired last January and is working at [employer] part time, kids are doing well.  She is due for tetanus in September.  She also reports a lump around her clavicle that she has noticed in the past month. She reports she has some generalized aching throughout her left collar bone. She denies any superficial redness of the skin, pus, drainage. She denies fevers and weight loss. She reports night sweats but reports this as part of menopause. She denies any respiratory symptoms, any exposure to TB.   Wt Readings from Last 3 Encounters:  02/14/18 156 lb (70.8 kg)  06/14/16 150 lb (68 kg)  06/05/16 149 lb 12.8 oz (67.9 kg)  ------------------------------------------------------ -----------   Review of Systems   12 point ROS reviewed and negative except for HPI.    Social History      She  reports that she has quit smoking. She has never used smokeless tobacco. She reports that she drinks alcohol. She reports that she does not use drugs.       Social History   Socioeconomic History  . Marital status: Married    Spouse name: Not on file  . Number of children: Not on file  . Years of education: Not on file  . Highest education level: Not on file  Occupational History  . Not on file  Social Needs  . Financial resource strain: Not on file  . Food  insecurity:    Worry: Not on file    Inability: Not on file  . Transportation needs:    Medical: Not on file    Non-medical: Not on file  Tobacco Use  . Smoking status: Former Games developer  . Smokeless tobacco: Never Used  . Tobacco comment: Quit at age 57  Substance and Sexual Activity  . Alcohol use: Yes    Comment: Ocassional alcohol use  . Drug use: No  . Sexual activity: Not on file  Lifestyle  . Physical activity:    Days per week: Not on file    Minutes per session: Not on file  . Stress: Not on file  Relationships  . Social connections:    Talks on phone: Not on file    Gets together: Not on file    Attends religious service: Not on file    Active member of club or organization: Not on file    Attends meetings of clubs or organizations: Not on file    Relationship status: Not on file  Other Topics Concern  . Not on file  Social History Narrative  . Not on file    No past medical history on file.   Patient Active Problem List   Diagnosis Date Noted  . Anxiety 06/05/2016  . Coitalgia 06/05/2016  . Status post bilateral breast implants 06/05/2016  .  History of urinary tract infection 06/05/2016  . Cannot sleep 06/05/2016  . Asymptomatic postmenopausal status 06/05/2016  . Cyst of ovary 06/05/2016  . Lung nodule, multiple 06/05/2016  . Feeling stressed out 06/05/2016    Past Surgical History:  Procedure Laterality Date  . TUBAL LIGATION      Family History        Family Status  Relation Name Status  . Mother  Alive  . Father  Alive  . MGM 3293 Deceased       died from old age  . MGF  Deceased       cause of death Alzheimers  . PGM  Deceased at age 56       Brain Tumot  . PGF  Deceased        Her family history includes Leukemia in her father; Thyroid disease in her mother.      Allergies  Allergen Reactions  . Codeine     Other reaction(s): Itching     Current Outpatient Medications:  .  estradiol (VIVELLE-DOT) 0.0375 MG/24HR, APPLY 1 PATCH  TO SKIN 2 TIMES EVERY WEEK, Disp: , Rfl: 1 .  JUNEL FE 1/20 1-20 MG-MCG tablet, , Disp: , Rfl:  .  MULTIPLE VITAMIN PO, Take by mouth., Disp: , Rfl:  .  progesterone (PROMETRIUM) 200 MG capsule, TAKE 1 CAPSULE BY MOUTH AT BEDTIME FOR 14 DAYS EVERY 3 MONTHS, Disp: , Rfl: 1   Patient Care Team: Maryella ShiversPollak, Adriana M, PA-C as PCP - General (Physician Assistant)      Objective:   Vitals: BP 112/78 (BP Location: Right Arm, Patient Position: Sitting, Cuff Size: Normal)   Pulse 64   Temp 98.7 F (37.1 C) (Oral)   Resp 16   Ht 5' 5.5" (1.664 m)   Wt 156 lb (70.8 kg)   BMI 25.56 kg/m    Vitals:   02/14/18 1019  BP: 112/78  Pulse: 64  Resp: 16  Temp: 98.7 F (37.1 C)  TempSrc: Oral  Weight: 156 lb (70.8 kg)  Height: 5' 5.5" (1.664 m)     Physical Exam  Constitutional: She is oriented to person, place, and time. She appears well-developed and well-nourished.  Neck:    Cardiovascular: Normal rate and regular rhythm.  Pulmonary/Chest: Effort normal and breath sounds normal.  Neurological: She is alert and oriented to person, place, and time.  Skin: Skin is warm and dry.  Psychiatric: She has a normal mood and affect. Her behavior is normal.     Depression Screen PHQ 2/9 Scores 06/14/2016  PHQ - 2 Score 0      Assessment & Plan:     Routine Health Maintenance and Physical Exam  Exercise Activities and Dietary recommendations Goals    None      Immunization History  Administered Date(s) Administered  . Tdap 04/14/2008    Health Maintenance  Topic Date Due  . Hepatitis C Screening  [birth year]  . HIV Screening  06/03/1977  . PAP SMEAR  05/31/2015  . MAMMOGRAM  05/28/2016  . INFLUENZA VACCINE  02/21/2018  . TETANUS/TDAP  04/14/2018  . COLONOSCOPY  05/29/2024     Discussed health benefits of physical activity, and encouraged her to engage in regular exercise appropriate for her age and condition.    1. Annual physical exam  Will abstract her PAP and  mammogram from her OBGYN, she is up to date in that sense. She will be due for tetanus shot in 2 months.  2. Supraclavicular mass  Does feel like a lymph node, she has a grossly visible and palpable mass in the area. This has been present for a month and do not see benefit to running any viral panels. Will get labs as below and order CT neck and chest to image the region to evaluate for malignancy.   3. Screening for deficiency anemia  - CBC With Differential  4. Diabetes mellitus screening  - Comprehensive Metabolic Panel (CMET)  5. Thyroid disorder screen  - TSH  6. Lipid screening  - Lipid Profile  7. Localized swelling, mass or lump of neck  - CT SOFT TISSUE NECK WO CONTRAST; Future  8. Clavicle enlargement  - CT CHEST WO CONTRAST; Future  Return in about 1 year (around 02/15/2019) for CPE.  The entirety of the information documented in the History of Present Illness, Review of Systems and Physical Exam were personally obtained by me. Portions of this information were initially documented by Kavin Leech, CMA and reviewed by me for thoroughness and accuracy.   --------------------------------------------------------------------    Trey Sailors, PA-C  [facility]

## 2018-02-15 ENCOUNTER — Telehealth: Payer: Self-pay

## 2018-02-15 LAB — COMPREHENSIVE METABOLIC PANEL
ALT: 21 IU/L (ref 0–32)
AST: 26 IU/L (ref 0–40)
Albumin/Globulin Ratio: 1.5 (ref 1.2–2.2)
Albumin: 4.4 g/dL (ref 3.5–5.5)
Alkaline Phosphatase: 81 IU/L (ref 39–117)
BUN/Creatinine Ratio: 14 (ref 9–23)
BUN: 13 mg/dL (ref 6–24)
Bilirubin Total: 0.3 mg/dL (ref 0.0–1.2)
CO2: 25 mmol/L (ref 20–29)
Calcium: 9.8 mg/dL (ref 8.7–10.2)
Chloride: 101 mmol/L (ref 96–106)
Creatinine, Ser: 0.93 mg/dL (ref 0.57–1.00)
GFR calc Af Amer: 80 mL/min/{1.73_m2} (ref >59)
GFR calc non Af Amer: 69 mL/min/{1.73_m2} (ref >59)
Globulin, Total: 3 g/dL (ref 1.5–4.5)
Glucose: 73 mg/dL (ref 65–99)
Potassium: 4.4 mmol/L (ref 3.5–5.2)
Sodium: 142 mmol/L (ref 134–144)
Total Protein: 7.4 g/dL (ref 6.0–8.5)

## 2018-02-15 LAB — CBC WITH DIFFERENTIAL
Basophils Absolute: 0.1 10*3/uL (ref 0.0–0.2)
Basos: 1 %
EOS (ABSOLUTE): 0.2 10*3/uL (ref 0.0–0.4)
Eos: 3 %
Hematocrit: 42.3 % (ref 34.0–46.6)
Hemoglobin: 13.8 g/dL (ref 11.1–15.9)
Immature Grans (Abs): 0 10*3/uL (ref 0.0–0.1)
Immature Granulocytes: 0 %
Lymphocytes Absolute: 3.3 10*3/uL — ABNORMAL HIGH (ref 0.7–3.1)
Lymphs: 46 %
MCH: 31.3 pg (ref 26.6–33.0)
MCHC: 32.6 g/dL (ref 31.5–35.7)
MCV: 96 fL (ref 79–97)
Monocytes Absolute: 0.4 10*3/uL (ref 0.1–0.9)
Monocytes: 5 %
Neutrophils Absolute: 3.2 10*3/uL (ref 1.4–7.0)
Neutrophils: 45 %
RBC: 4.41 x10E6/uL (ref 3.77–5.28)
RDW: 11.9 % — ABNORMAL LOW (ref 12.3–15.4)
WBC: 7.1 10*3/uL (ref 3.4–10.8)

## 2018-02-15 LAB — LIPID PANEL
Chol/HDL Ratio: 2.7 ratio (ref 0.0–4.4)
Cholesterol, Total: 200 mg/dL — ABNORMAL HIGH (ref 100–199)
HDL: 74 mg/dL (ref >39)
LDL Calculated: 112 mg/dL — ABNORMAL HIGH (ref 0–99)
Triglycerides: 71 mg/dL (ref 0–149)
VLDL Cholesterol Cal: 14 mg/dL (ref 5–40)

## 2018-02-15 LAB — TSH: TSH: 5.41 u[IU]/mL — ABNORMAL HIGH (ref 0.450–4.500)

## 2018-02-15 NOTE — Addendum Note (Signed)
Addended by: Trey SailorsPOLLAK, ADRIANA M on: 02/15/2018 02:57 PM   Modules accepted: Orders

## 2018-02-15 NOTE — Telephone Encounter (Signed)
Orders placed for fax to [facility].

## 2018-02-15 NOTE — Telephone Encounter (Signed)
Patient called to let Ricki Rodriguezdriana know that she would like to go to [facility] (in [place]) to have her CT scans done. \  She also reports that they will give her an actual DVD of the scans so that Ricki Rodriguezdriana can review. Just FYI. Thanks!

## 2018-02-18 ENCOUNTER — Telehealth: Payer: Self-pay | Admitting: Physician Assistant

## 2018-02-18 NOTE — Telephone Encounter (Signed)
Clarified that we will do CT chest WO for left clavicular mass.

## 2018-02-18 NOTE — Telephone Encounter (Signed)
Kristi Shepherd with [facility] called [facility] to [facility]  States there are two orders in the system for CT and when scheduler called to see what needed to be scheduled was told to schedule whichever the patient needed.  States they are not allowed to make the call on what order to schedule.  Kristi Freestoneatty is requesting a call back at [phone] to discuss with provider.

## 2018-02-19 ENCOUNTER — Telehealth: Payer: Self-pay | Admitting: Physician Assistant

## 2018-02-19 NOTE — Telephone Encounter (Signed)
[facility] called saying they faxed over a request an Authorization for chest CT.yesterday.    They have to have the authorization done before she gets the CT which is scheduled for tomorrow 7/31 in the morning.   They can take a verbal as long as the referral is done through [employer] which is the company that does authorizations for [employer].  The contact number person calling is   [phone] press # [phone]  Thanks Barth Kirksteri

## 2018-02-20 ENCOUNTER — Telehealth: Payer: Self-pay | Admitting: Physician Assistant

## 2018-02-20 ENCOUNTER — Telehealth: Payer: Self-pay

## 2018-02-20 DIAGNOSIS — M89319 Hypertrophy of bone, unspecified shoulder: Secondary | ICD-10-CM

## 2018-02-20 NOTE — Addendum Note (Signed)
Addended by: Trey SailorsPOLLAK, ADRIANA M on: 02/20/2018 04:56 PM   Modules accepted: Orders

## 2018-02-20 NOTE — Telephone Encounter (Signed)
Pt advised.  She would like to get the X[facility] at [facility].   Thanks,   -Vernona RiegerLaura

## 2018-02-20 NOTE — Telephone Encounter (Signed)
-----   Message from Trey SailorsAdriana M Pollak, [place]PA-C sent at 02/18/2018 12:58 PM EDT ----- Her labwork is all normal except for TSH slightly high. Can we add free T3 and T4 under dx elevated TSH? Also, we have [facility]ed [facility], I will be waiting to hear back from them.

## 2018-02-20 NOTE — Telephone Encounter (Signed)
Pt advised.   Thanks,   -Teala Daffron  

## 2018-02-20 NOTE — Telephone Encounter (Signed)
Insurance company is requiring a peer to peer for chest CT.Phone [phone].Member # [id number]

## 2018-02-20 NOTE — Telephone Encounter (Signed)
Insurance has denied my request for a CT chest scan because a CXR or ultrasound was not first attempted and was nondiagnostic. So I need to order one of these first, CXR will be easier and I do anticipate she will still need a CT scan. With patient permission I will order CXR. If she wants [facility] we will have to send orders over there. If not, she can get it done at [facility] facility, please let me know so I know how to handle the order.

## 2018-02-20 NOTE — Telephone Encounter (Signed)
CXR order placed and will fax to [facility].

## 2018-02-22 LAB — T3, FREE: T3, Free: 3.1 pg/mL (ref 2.0–4.4)

## 2018-02-22 LAB — T4, FREE: Free T4: 1.2 ng/dL (ref 0.82–1.77)

## 2018-02-22 LAB — SPECIMEN STATUS REPORT

## 2018-02-25 ENCOUNTER — Encounter: Payer: Self-pay | Admitting: Physician Assistant

## 2018-02-26 ENCOUNTER — Encounter: Payer: Self-pay | Admitting: Physician Assistant

## 2018-02-26 ENCOUNTER — Telehealth: Payer: Self-pay | Admitting: Physician Assistant

## 2018-02-26 DIAGNOSIS — R221 Localized swelling, mass and lump, neck: Secondary | ICD-10-CM

## 2018-02-26 NOTE — Telephone Encounter (Signed)
[facility]. Patient has left clavicular mass that insurance required a CXR. Spoke with reading radiologist at [facility] and the Xray is NON diagnostic. Per my previous discussions with insurance, this would allow us to proceed with further imaging.   Spoke with patient personally today as well and she reports the mass has increased from just slightly above her left clavicle to 3 fingers breadth superior to her left clavicle. She is not having any trouble breathing but is feeling some fatigue. I am going to proceed with ordering a CT Chest and Neck with Contrast per my conversation with the reading radiologist.   The xray technologist reported that the [facility] had [facility]. Can we please have this report faxed over to the referral center as they will need a negative CXR to make this referral?  Can we call patient and let her know that I have ordered BOTH a CT Chest and CT Neck? I have spoken with the reading radiologist and I do think BOTH are necessary.

## 2018-02-27 ENCOUNTER — Other Ambulatory Visit: Payer: Self-pay | Admitting: Physician Assistant

## 2018-02-27 ENCOUNTER — Telehealth: Payer: Self-pay | Admitting: Physician Assistant

## 2018-02-27 DIAGNOSIS — R221 Localized swelling, mass and lump, neck: Secondary | ICD-10-CM

## 2018-02-27 NOTE — Telephone Encounter (Signed)
Pt would like a call to discuss results of chest x ray.Also [facility] will be sending new order for CT of soft tissue neck if you can sign and fax back

## 2018-02-27 NOTE — Telephone Encounter (Signed)
I have done peer to peer for this order and it was denied by Dr. Ulice BoldMcKitty. He is apparently mailing me the denial on the grounds that the CTA neck will be approved and that she needs a biopsy before the CT chest will be done. If the biopsy proves to need further follow up THEN I can get a CT Chest. So I think we have to proceed with only CTA neck.

## 2018-02-27 NOTE — Telephone Encounter (Signed)
Insurance company will need to speak peer to peer to get CT of chest approved.Phone [phone].Member # [id number].CT of neck has been approved

## 2018-03-01 ENCOUNTER — Encounter: Payer: Self-pay | Admitting: Physician Assistant

## 2018-03-01 NOTE — Telephone Encounter (Signed)
CXR normal, fax is on my desk signed and ready to scan. Will proceed with CT neck. Can we call this pt with results, thank you.

## 2018-03-05 ENCOUNTER — Encounter: Payer: Self-pay | Admitting: Physician Assistant

## 2018-03-08 ENCOUNTER — Telehealth: Payer: Self-pay | Admitting: Physician Assistant

## 2018-03-08 ENCOUNTER — Encounter: Payer: Self-pay | Admitting: Physician Assistant

## 2018-03-08 DIAGNOSIS — R222 Localized swelling, mass and lump, trunk: Secondary | ICD-10-CM

## 2018-03-08 NOTE — Telephone Encounter (Signed)
Pt advised she would like to be referred.   Thanks,   -Vernona RiegerLaura

## 2018-03-08 NOTE — Telephone Encounter (Signed)
I have reviewed the [facility]. IN the area of clinical concern there are some arthritic changes with mild fluid in the sternoclavicular joint when compared to the right but no mass lesion. Radiologist thought this likely accounted for palpable lesion, though no visible mass. Remainder of CT normal. If this mass is still present, I can refer patient to a specialist (ENT vs. Ortho) for a sample of this.

## 2018-03-11 NOTE — Telephone Encounter (Signed)
Referral placed.

## 2018-03-11 NOTE — Addendum Note (Signed)
Addended by: Trey SailorsPOLLAK, ADRIANA M on: 03/11/2018 05:23 PM   Modules accepted: Orders

## 2018-04-24 ENCOUNTER — Telehealth: Payer: Self-pay | Admitting: Physician Assistant

## 2018-04-24 NOTE — Telephone Encounter (Signed)
Patient advised.

## 2018-04-24 NOTE — Telephone Encounter (Signed)
Pt needing to come by to pick up the CT Scan disk and Xray she left w/ Adriana. She is needing to pick them up to take to an Orthopedic appointment. Please call pt to let her know when they are ready for pickup.  Thanks, [employer]

## 2018-04-24 NOTE — Telephone Encounter (Signed)
CD left up front.

## 2019-06-26 ENCOUNTER — Encounter: Payer: BC Managed Care – PPO | Admitting: Physician Assistant

## 2019-07-11 ENCOUNTER — Telehealth: Payer: Self-pay

## 2019-07-11 NOTE — Telephone Encounter (Signed)
Copied from [place] [phone]. Topic: General - Other >> Jul 11, 2019  3:22 PM Erick Blinks wrote: [facility] [address]

## 2019-07-14 NOTE — Telephone Encounter (Signed)
Have not received the faxed yet just a FYI.

## 2020-05-26 NOTE — Progress Notes (Signed)
Complete physical exam   Patient: Kristi Shepherd   DOB: 1960/08/24   59 y.o. Female  MRN: [id number] Visit Date: 05/27/2020  Today's healthcare provider: Trey Sailors, PA-C   Chief Complaint  Patient presents with  . Annual Exam  Kristi Shepherd,Kristi Shepherd,acting as a scribe for Trey Sailors, PA-C.,have documented all relevant documentation on the behalf of Trey Sailors, PA-C,as directed by  Trey Sailors, PA-C while in the presence of Trey Sailors, PA-C.  Subjective    Kristi Shepherd is a 59 y.o. female who presents today for a complete physical exam.  She reports consuming a general diet. Home exercise routine includes walking. She generally feels well. She reports sleeping fairly well. She does not have additional problems to discuss today.  HPI   First COVID shot 05/18/2020, second shot due in 06/13/2020 Postpone tetanus and flu shot due to this reason, counsel on shingles.   Obesity: Reports not exercising and frequent snacking. She has gained weight since COVID.   Wt Readings from Last 3 Encounters:  05/27/20 186 lb 14.4 oz (84.8 kg)  02/14/18 156 lb (70.8 kg)  06/14/16 150 lb (68 kg)     No past medical history on file. Past Surgical History:  Procedure Laterality Date  . TUBAL LIGATION     Social History   Socioeconomic History  . Marital status: Married    Spouse name: Not on file  . Number of children: Not on file  . Years of education: Not on file  . Highest education level: Not on file  Occupational History  . Not on file  Tobacco Use  . Smoking status: Former Games developer  . Smokeless tobacco: Never Used  . Tobacco comment: Quit at age 76  Substance and Sexual Activity  . Alcohol use: Yes    Comment: Ocassional alcohol use  . Drug use: No  . Sexual activity: Not on file  Other Topics Concern  . Not on file  Social History Narrative  . Not on file   Social Determinants of Health   Financial Resource Strain:   . Difficulty of  Paying Living Expenses: Not on file  Food Insecurity:   . Worried About Programme researcher, broadcasting/film/video in the Last Year: Not on file  . Ran Out of Food in the Last Year: Not on file  Transportation Needs:   . Lack of Transportation (Medical): Not on file  . Lack of Transportation (Non-Medical): Not on file  Physical Activity:   . Days of Exercise per Week: Not on file  . Minutes of Exercise per Session: Not on file  Stress:   . Feeling of Stress : Not on file  Social Connections:   . Frequency of Communication with Friends and Family: Not on file  . Frequency of Social Gatherings with Friends and Family: Not on file  . Attends Religious Services: Not on file  . Active Member of Clubs or Organizations: Not on file  . Attends Banker Meetings: Not on file  . Marital Status: Not on file  Intimate Partner Violence:   . Fear of Current or Ex-Partner: Not on file  . Emotionally Abused: Not on file  . Physically Abused: Not on file  . Sexually Abused: Not on file   Family Status  Relation Name Status  . Mother  Alive  . Father  Alive  . MGM 24 Deceased       died from old age  .  MGF  Deceased       cause of death Alzheimers  . PGM  Deceased at age 67       Brain Tumot  . PGF  Deceased   Family History  Problem Relation Age of Onset  . Thyroid disease Mother   . Leukemia Father    Allergies  Allergen Reactions  . Codeine     Other reaction(s): Itching    Patient Care Team: Maryella Shivers as PCP - General (Physician Assistant)   Medications: Outpatient Medications Prior to Visit  Medication Sig  . meloxicam (MOBIC) 15 MG tablet Take 15 mg by mouth daily.  . MULTIPLE VITAMIN PO Take by mouth.  . [DISCONTINUED] estradiol (VIVELLE-DOT) 0.0375 MG/24HR APPLY 1 PATCH TO SKIN 2 TIMES EVERY WEEK  . [DISCONTINUED] JUNEL FE 1/20 1-20 MG-MCG tablet   . [DISCONTINUED] progesterone (PROMETRIUM) 200 MG capsule TAKE 1 CAPSULE BY MOUTH AT BEDTIME FOR 14 DAYS EVERY 3 MONTHS     No facility-administered medications prior to visit.    Review of Systems  Constitutional: Negative.   HENT: Negative.   Eyes: Negative.   Respiratory: Negative.   Cardiovascular: Negative.   Gastrointestinal: Negative.   Endocrine: Negative.   Genitourinary: Negative.   Musculoskeletal: Negative.   Skin: Negative.   Allergic/Immunologic: Negative.   Neurological: Negative.   Hematological: Negative.   Psychiatric/Behavioral: Negative.       Objective    BP 137/81 (BP Location: Left Arm, Patient Position: Sitting, Cuff Size: Large)   Pulse 71   Temp 98 F (36.7 C) (Oral)   Ht 5' 5.5" (1.664 m)   Wt 186 lb 14.4 oz (84.8 kg)   SpO2 99%   BMI 30.63 kg/m    Physical Exam Constitutional:      Appearance: Normal appearance.  HENT:     Right Ear: Tympanic membrane, ear canal and external ear normal.     Left Ear: Tympanic membrane, ear canal and external ear normal.  Cardiovascular:     Rate and Rhythm: Normal rate and regular rhythm.     Pulses: Normal pulses.     Heart sounds: Normal heart sounds.  Pulmonary:     Effort: Pulmonary effort is normal.     Breath sounds: Normal breath sounds.  Abdominal:     General: Abdomen is flat. Bowel sounds are normal.     Palpations: Abdomen is soft.  Skin:    General: Skin is warm and dry.  Neurological:     General: No focal deficit present.     Mental Status: She is alert and oriented to person, place, and time.  Psychiatric:        Mood and Affect: Mood normal.        Behavior: Behavior normal.       Last depression screening scores PHQ 2/9 Scores 05/27/2020 02/25/2018 02/25/2018  PHQ - 2 Score 0 0 0  PHQ- 9 Score 0 1 -   Last fall risk screening Fall Risk  05/27/2020  Falls in the past year? 0  Number falls in past yr: 0  Injury with Fall? 0   Last Audit-C alcohol use screening Alcohol Use Disorder Test (AUDIT) 05/27/2020  1. How often do you have a drink containing alcohol? 2  2. How many drinks containing  alcohol do you have on a typical day when you are drinking? 0  3. How often do you have six or more drinks on one occasion? 0  AUDIT-C Score 2  4.  How often during the last year have you found that you were not able to stop drinking once you had started? 0  5. How often during the last year have you failed to do what was normally expected from you because of drinking? 0  6. How often during the last year have you needed a first drink in the morning to get yourself going after a heavy drinking session? 0  7. How often during the last year have you had a feeling of guilt of remorse after drinking? 0  8. How often during the last year have you been unable to remember what happened the night before because you had been drinking? 0  9. Have you or someone else been injured as a result of your drinking? 0  10. Has a relative or friend or a doctor or another health worker been concerned about your drinking or suggested you cut down? 0  Alcohol Use Disorder Identification Test Final Score (AUDIT) 2   A score of 3 or more in women, and 4 or more in men indicates increased risk for alcohol abuse, EXCEPT if all of the points are from question 1   No results found for any visits on 05/27/20.  Assessment & Plan    Routine Health Maintenance and Physical Exam  Exercise Activities and Dietary recommendations Goals   None     Immunization History  Administered Date(s) Administered  . Influenza Split 05/30/2012  . Influenza-Unspecified 07/11/2019  . Moderna SARS-COVID-2 Vaccination 05/18/2020  . Tdap 04/14/2008    Health Maintenance  Topic Date Due  . Hepatitis C Screening  Never done  . MAMMOGRAM  06/30/2019  . INFLUENZA VACCINE  10/21/2020 (Originally 02/22/2020)  . TETANUS/TDAP  05/27/2021 (Originally 04/14/2018)  . COVID-19 Vaccine (2 - Moderna 2-dose series) 06/15/2020  . PAP SMEAR-Modifier  06/08/2022  . COLONOSCOPY  05/29/2024  . HIV Screening  Completed    Discussed health benefits  of physical activity, and encouraged her to engage in regular exercise appropriate for her age and condition.   1. Annual physical exam  - TSH - Lipid panel - Comprehensive metabolic panel - CBC with Differential/Platelet  2. Encounter for screening mammogram for malignant neoplasm of breast  - MM Digital Screening; Future  3. Encounter for screening for HIV  - HIV Antibody (routine testing w rflx)  4. Encounter for hepatitis C screening test for low risk patient  - Hepatitis C antibody  5. Class 1 obesity due to excess calories without serious comorbidity with body mass index (BMI) of 30.0 to 30.9 in adult  Discussed importance of healthy weight management Discussed diet and exercise Counseled on contacting insurance to see which weight loss medications are covered, provided list on AVS.    Return in about 1 year (around 05/27/2021) for CPE .     ITrey Sailors, PA-C, have reviewed all documentation for this visit. The documentation on 06/02/20 for the exam, diagnosis, procedures, and orders are all accurate and complete.  The entirety of the information documented in the History of Present Illness, Review of Systems and Physical Exam were personally obtained by me. Portions of this information were initially documented by [facility] and reviewed by me for thoroughness and accuracy.     Maryella Shivers  [facility] [phone] (phone) [phone] (fax)  [facility]

## 2020-05-27 ENCOUNTER — Other Ambulatory Visit: Payer: Self-pay

## 2020-05-27 ENCOUNTER — Encounter: Payer: Self-pay | Admitting: Physician Assistant

## 2020-05-27 ENCOUNTER — Ambulatory Visit (INDEPENDENT_AMBULATORY_CARE_PROVIDER_SITE_OTHER): Payer: BC Managed Care – PPO | Admitting: Physician Assistant

## 2020-05-27 VITALS — BP 137/81 | HR 71 | Temp 98.0°F | Ht 65.5 in | Wt 186.9 lb

## 2020-05-27 DIAGNOSIS — Z114 Encounter for screening for human immunodeficiency virus [HIV]: Secondary | ICD-10-CM | POA: Diagnosis not present

## 2020-05-27 DIAGNOSIS — Z6830 Body mass index (BMI) 30.0-30.9, adult: Secondary | ICD-10-CM

## 2020-05-27 DIAGNOSIS — Z1159 Encounter for screening for other viral diseases: Secondary | ICD-10-CM

## 2020-05-27 DIAGNOSIS — Z0000 Encounter for general adult medical examination without abnormal findings: Secondary | ICD-10-CM | POA: Diagnosis not present

## 2020-05-27 DIAGNOSIS — Z1231 Encounter for screening mammogram for malignant neoplasm of breast: Secondary | ICD-10-CM | POA: Diagnosis not present

## 2020-05-27 DIAGNOSIS — R634 Abnormal weight loss: Secondary | ICD-10-CM

## 2020-05-27 DIAGNOSIS — E6609 Other obesity due to excess calories: Secondary | ICD-10-CM

## 2020-05-27 NOTE — Patient Instructions (Signed)
Branded Weight Loss Medications  Qsymia Belviq Saxenda Wegovy Contrave  Off Label Weight Loss Medications  Topamax  

## 2020-06-11 LAB — COMPREHENSIVE METABOLIC PANEL
ALT: 20 IU/L (ref 0–32)
AST: 19 IU/L (ref 0–40)
Albumin/Globulin Ratio: 1.4 (ref 1.2–2.2)
Albumin: 4.3 g/dL (ref 3.8–4.9)
Alkaline Phosphatase: 106 IU/L (ref 44–121)
BUN/Creatinine Ratio: 14 (ref 9–23)
BUN: 12 mg/dL (ref 6–24)
Bilirubin Total: 0.3 mg/dL (ref 0.0–1.2)
CO2: 22 mmol/L (ref 20–29)
Calcium: 9.4 mg/dL (ref 8.7–10.2)
Chloride: 102 mmol/L (ref 96–106)
Creatinine, Ser: 0.86 mg/dL (ref 0.57–1.00)
GFR calc Af Amer: 86 mL/min/{1.73_m2} (ref >59)
GFR calc non Af Amer: 75 mL/min/{1.73_m2} (ref >59)
Globulin, Total: 3 g/dL (ref 1.5–4.5)
Glucose: 97 mg/dL (ref 65–99)
Potassium: 4.9 mmol/L (ref 3.5–5.2)
Sodium: 139 mmol/L (ref 134–144)
Total Protein: 7.3 g/dL (ref 6.0–8.5)

## 2020-06-11 LAB — CBC WITH DIFFERENTIAL/PLATELET
Basophils Absolute: 0.1 10*3/uL (ref 0.0–0.2)
Basos: 1 %
EOS (ABSOLUTE): 0.2 10*3/uL (ref 0.0–0.4)
Eos: 3 %
Hematocrit: 42.6 % (ref 34.0–46.6)
Hemoglobin: 14.2 g/dL (ref 11.1–15.9)
Immature Grans (Abs): 0 10*3/uL (ref 0.0–0.1)
Immature Granulocytes: 0 %
Lymphocytes Absolute: 2.6 10*3/uL (ref 0.7–3.1)
Lymphs: 43 %
MCH: 31.6 pg (ref 26.6–33.0)
MCHC: 33.3 g/dL (ref 31.5–35.7)
MCV: 95 fL (ref 79–97)
Monocytes Absolute: 0.4 10*3/uL (ref 0.1–0.9)
Monocytes: 7 %
Neutrophils Absolute: 2.8 10*3/uL (ref 1.4–7.0)
Neutrophils: 46 %
Platelets: 228 10*3/uL (ref 150–450)
RBC: 4.5 x10E6/uL (ref 3.77–5.28)
RDW: 12 % (ref 11.7–15.4)
WBC: 6.1 10*3/uL (ref 3.4–10.8)

## 2020-06-11 LAB — LIPID PANEL
Chol/HDL Ratio: 3.7 ratio (ref 0.0–4.4)
Cholesterol, Total: 218 mg/dL — ABNORMAL HIGH (ref 100–199)
HDL: 59 mg/dL (ref >39)
LDL Chol Calc (NIH): 142 mg/dL — ABNORMAL HIGH (ref 0–99)
Triglycerides: 97 mg/dL (ref 0–149)
VLDL Cholesterol Cal: 17 mg/dL (ref 5–40)

## 2020-06-11 LAB — TSH: TSH: 3.96 u[IU]/mL (ref 0.450–4.500)

## 2020-06-11 LAB — HIV ANTIBODY (ROUTINE TESTING W REFLEX): HIV Screen 4th Generation wRfx: NONREACTIVE

## 2020-06-11 LAB — HEPATITIS C ANTIBODY: Hep C Virus Ab: <0.1 s/co ratio (ref 0.0–0.9)

## 2020-06-14 ENCOUNTER — Other Ambulatory Visit: Payer: Self-pay

## 2020-06-14 ENCOUNTER — Encounter: Payer: Self-pay | Admitting: Physician Assistant

## 2020-06-14 ENCOUNTER — Ambulatory Visit: Payer: BC Managed Care – PPO | Admitting: Physician Assistant

## 2020-06-14 VITALS — BP 127/71 | HR 64 | Temp 97.9°F | Ht 65.5 in | Wt 184.1 lb

## 2020-06-14 DIAGNOSIS — T148XXA Other injury of unspecified body region, initial encounter: Secondary | ICD-10-CM

## 2020-06-14 NOTE — Progress Notes (Signed)
Established patient visit   Patient: Kristi Shepherd   DOB: 1961-02-07   59 y.o. Female  MRN: [id number] Visit Date: 06/14/2020  Today's healthcare provider: Trey Sailors, PA-C   No chief complaint on file. I,Maximiliano Cromartie M Yussef Jorge,acting as a Neurosurgeon for Trey Sailors, PA-C.,have documented all relevant documentation on the behalf of Trey Sailors, PA-C,as directed by  Trey Sailors, PA-C while in the presence of Trey Sailors, PA-C.  Subjective    HPI  Patient presents today for a bruised on her left lower leg that she noticed on Saturday,06/12/2020. She states she was concerned because it is near a knot she had for about a year now. She reports leave blowing last Thursday, 06/10/2020. She denies hiting the knee or leg on anything. She reports some pain above the left knee.  She reports she has a soft tissue prominence on her right knee that has been present for over a year. She reports this occurred previously after she remained in a kneeling position for a long time. The bruise is around this area. She denies swelling or pain. Denies SOB. Denies weakness.      Medications: Outpatient Medications Prior to Visit  Medication Sig  . meloxicam (MOBIC) 15 MG tablet Take 15 mg by mouth daily.  . MULTIPLE VITAMIN PO Take by mouth.   No facility-administered medications prior to visit.    Review of Systems  Constitutional: Negative.   Respiratory: Negative.   Cardiovascular: Negative.   Hematological: Bruises/bleeds easily.      Objective    BP 127/71 (BP Location: Left Arm, Patient Position: Sitting, Cuff Size: Large)   Pulse 64   Temp 97.9 F (36.6 C) (Oral)   Ht 5' 5.5" (1.664 m)   Wt 184 lb 1.6 oz (83.5 kg)   SpO2 99%   BMI 30.17 kg/m    Physical Exam Constitutional:      Appearance: Normal appearance.  Cardiovascular:     Rate and Rhythm: Normal rate.  Pulmonary:     Effort: Pulmonary effort is normal.  Musculoskeletal:     Right knee:  Swelling and ecchymosis present. Normal range of motion.     Left knee: Normal.       Legs:  Skin:    General: Skin is warm and dry.  Neurological:     General: No focal deficit present.     Mental Status: She is alert and oriented to person, place, and time.  Psychiatric:        Mood and Affect: Mood normal.        Behavior: Behavior normal.       No results found for any visits on 06/14/20.  Assessment & Plan    1. Bruise  Her exam appears to be consistent with bruising. Do not suspect she has a DVT. Counseled on supportive care.   Return if symptoms worsen or fail to improve.      ITrey Sailors, PA-C, have reviewed all documentation for this visit. The documentation on 06/22/20 for the exam, diagnosis, procedures, and orders are all accurate and complete.   The entirety of the information documented in the History of Present Illness, Review of Systems and Physical Exam were personally obtained by me. Portions of this information were initially documented by [facility] and reviewed by me for thoroughness and accuracy.   I spent 20 minutes dedicated to the care of this patient on the date of this encounter to include  pre-visit review of records, face-to-face time with the patient discussing bruising, and post visit ordering of testing.   Maryella Shivers  [facility] [phone] (phone) [phone] (fax)  [facility]

## 2020-06-14 NOTE — Patient Instructions (Signed)
Edema  Edema is when you have too much fluid in your body or under your skin. Edema may make your legs, feet, and ankles swell up. Swelling is also common in looser tissues, like around your eyes. This is a common condition. It gets more common as you get older. There are many possible causes of edema. Eating too much salt (sodium) and being on your feet or sitting for a long time can cause edema in your legs, feet, and ankles. Hot weather may make edema worse. Edema is usually painless. Your skin may look swollen or shiny. Follow these instructions at home:  Keep the swollen body part raised (elevated) above the level of your heart when you are sitting or lying down.  Do not sit still or stand for a long time.  Do not wear tight clothes. Do not wear garters on your upper legs.  Exercise your legs. This can help the swelling go down.  Wear elastic bandages or support stockings as told by your doctor.  Eat a low-salt (low-sodium) diet to reduce fluid as told by your doctor.  Depending on the cause of your swelling, you may need to limit how much fluid you drink (fluid restriction).  Take over-the-counter and prescription medicines only as told by your doctor. Contact a doctor if:  Treatment is not working.  You have heart, liver, or kidney disease and have symptoms of edema.  You have sudden and unexplained weight gain. Get help right away if:  You have shortness of breath or chest pain.  You cannot breathe when you lie down.  You have pain, redness, or warmth in the swollen areas.  You have heart, liver, or kidney disease and get edema all of a sudden.  You have a fever and your symptoms get worse all of a sudden. Summary  Edema is when you have too much fluid in your body or under your skin.  Edema may make your legs, feet, and ankles swell up. Swelling is also common in looser tissues, like around your eyes.  Raise (elevate) the swollen body part above the level of your  heart when you are sitting or lying down.  Follow your doctor's instructions about diet and how much fluid you can drink (fluid restriction). This information is not intended to replace advice given to you by your health care provider. Make sure you discuss any questions you have with your health care provider. Document Revised: 07/13/2017 Document Reviewed: 07/28/2016 Elsevier Patient Education  2020 Elsevier Inc.  

## 2020-07-22 LAB — HM MAMMOGRAPHY

## 2021-05-27 ENCOUNTER — Encounter: Payer: Self-pay | Admitting: Physician Assistant

## 2021-05-30 ENCOUNTER — Other Ambulatory Visit: Payer: Self-pay

## 2021-05-30 ENCOUNTER — Ambulatory Visit (INDEPENDENT_AMBULATORY_CARE_PROVIDER_SITE_OTHER): Payer: BC Managed Care – PPO | Admitting: Family Medicine

## 2021-05-30 ENCOUNTER — Encounter: Payer: Self-pay | Admitting: Family Medicine

## 2021-05-30 VITALS — BP 121/83 | HR 65 | Ht 65.5 in | Wt 183.7 lb

## 2021-05-30 DIAGNOSIS — E6609 Other obesity due to excess calories: Secondary | ICD-10-CM

## 2021-05-30 DIAGNOSIS — E7800 Pure hypercholesterolemia, unspecified: Secondary | ICD-10-CM | POA: Diagnosis not present

## 2021-05-30 DIAGNOSIS — Z6830 Body mass index (BMI) 30.0-30.9, adult: Secondary | ICD-10-CM

## 2021-05-30 DIAGNOSIS — R635 Abnormal weight gain: Secondary | ICD-10-CM

## 2021-05-30 DIAGNOSIS — Z0000 Encounter for general adult medical examination without abnormal findings: Secondary | ICD-10-CM | POA: Diagnosis not present

## 2021-05-30 DIAGNOSIS — N951 Menopausal and female climacteric states: Secondary | ICD-10-CM

## 2021-05-30 DIAGNOSIS — Z1231 Encounter for screening mammogram for malignant neoplasm of breast: Secondary | ICD-10-CM

## 2021-05-30 DIAGNOSIS — Z23 Encounter for immunization: Secondary | ICD-10-CM | POA: Diagnosis not present

## 2021-05-30 DIAGNOSIS — E66811 Obesity, class 1: Secondary | ICD-10-CM | POA: Insufficient documentation

## 2021-05-30 DIAGNOSIS — R6889 Other general symptoms and signs: Secondary | ICD-10-CM

## 2021-05-30 DIAGNOSIS — Z789 Other specified health status: Secondary | ICD-10-CM

## 2021-05-30 MED ORDER — PHENTERMINE HCL 37.5 MG PO CAPS: 37.5000 mg | ORAL_CAPSULE | ORAL | 0 refills | Status: DC

## 2021-05-30 MED ORDER — ZOSTER VAC RECOMB ADJUVANTED 50 MCG/0.5ML IM SUSR: 0.5000 mL | Freq: Once | INTRAMUSCULAR | 0 refills | Status: AC

## 2021-05-30 NOTE — Assessment & Plan Note (Signed)
Check thyroid levels °

## 2021-05-30 NOTE — Assessment & Plan Note (Signed)
Wishing to lose weight- having to buy larger size clothing

## 2021-05-30 NOTE — Assessment & Plan Note (Signed)
Rx provided to get at local pharmacy

## 2021-05-30 NOTE — Assessment & Plan Note (Signed)
Repeat lipid panel ?

## 2021-05-30 NOTE — Assessment & Plan Note (Signed)
UTD on dental/eye exams Things to do to keep yourself healthy  - Exercise at least 30-45 minutes a day, 3-4 days a week.  - Eat a low-fat diet with lots of fruits and vegetables, up to 7-9 servings per day.  - Seatbelts can save your life. Wear them always.  - Smoke detectors on every level of your home, check batteries every year.  - Eye Doctor - have an eye exam every 1-2 years  - Safe sex - if you may be exposed to STDs, use a condom.  - Alcohol -  If you drink, do it moderately, less than 2 drinks per day.  - Health Care Power of Attorney. Choose someone to speak for you if you are not able.  - Depression is common in our stressful world.If you're feeling down or losing interest in things you normally enjoy, please come in for a visit.  - Violence - If anyone is threatening or hurting you, please call immediately.   

## 2021-05-30 NOTE — Assessment & Plan Note (Addendum)
BMI 30.1 Discussed importance of healthy weight management Discussed diet and exercise Trial of medication- discussed SEs

## 2021-05-30 NOTE — Assessment & Plan Note (Signed)
Pt reported weight gain Not obvious on our scales from annual visit alone

## 2021-05-30 NOTE — Assessment & Plan Note (Signed)
External exam location d/t breast implant- due next month

## 2021-05-30 NOTE — Progress Notes (Signed)
Complete physical exam   Patient: Kristi Shepherd   DOB: [birth year]   60 y.o. Female  MRN: [id number] Visit Date: 05/30/2021  Today's healthcare provider: Jacky Kindle, FNP   Going out for her bday with family and grandkids and step grandkids.  Subjective    Kristi Shepherd is a 60 y.o. female who presents today for a complete physical exam.  She reports consuming a general diet. The patient does not participate in regular exercise at present. She generally feels well. She reports sleeping fairly well. She does have additional problems to discuss today.  HPI  -weight gain  History reviewed. No pertinent past medical history. Past Surgical History:  Procedure Laterality Date   TUBAL LIGATION     Social History   Socioeconomic History   Marital status: Married    Spouse name: Not on file   Number of children: Not on file   Years of education: Not on file   Highest education level: Not on file  Occupational History   Not on file  Tobacco Use   Smoking status: Former   Smokeless tobacco: Never   Tobacco comments:    Quit at age 71  Substance and Sexual Activity   Alcohol use: Yes    Comment: Ocassional alcohol use   Drug use: No   Sexual activity: Not on file  Other Topics Concern   Not on file  Social History Narrative   Not on file   Social Determinants of Health   Financial Resource Strain: Not on file  Food Insecurity: Not on file  Transportation Needs: Not on file  Physical Activity: Not on file  Stress: Not on file  Social Connections: Not on file  Intimate Partner Violence: Not on file   Family Status  Relation Name Status   Mother  Alive   Father  Alive   MGM 62 Deceased       died from old age   MGF  Deceased       cause of death Alzheimers   PGM  Deceased at age 88       Brain Tumot   PGF  Deceased   Family History  Problem Relation Age of Onset   Thyroid disease Mother    Leukemia Father    Allergies  Allergen Reactions    Codeine     Other reaction(s): Itching    Patient Care Team: Jacky Kindle, FNP as PCP - General (Family Medicine)   Medications: Outpatient Medications Prior to Visit  Medication Sig   meloxicam (MOBIC) 15 MG tablet Take 15 mg by mouth daily.   MULTIPLE VITAMIN PO Take by mouth.   No facility-administered medications prior to visit.    Review of Systems  Constitutional:  Positive for unexpected weight change.  Allergic/Immunologic: Positive for environmental allergies.  All other systems reviewed and are negative.  Last CBC Lab Results  Component Value Date   WBC 6.1 06/10/2020   HGB 14.2 06/10/2020   HCT 42.6 06/10/2020   MCV 95 06/10/2020   MCH 31.6 06/10/2020   RDW 12.0 06/10/2020   PLT 228 06/10/2020   Last metabolic panel Lab Results  Component Value Date   GLUCOSE 97 06/10/2020   NA 139 06/10/2020   K 4.9 06/10/2020   CL 102 06/10/2020   CO2 22 06/10/2020   BUN 12 06/10/2020   CREATININE 0.86 06/10/2020   GFRNONAA 75 06/10/2020   CALCIUM 9.4 06/10/2020   PROT 7.3 06/10/2020  ALBUMIN 4.3 06/10/2020   LABGLOB 3.0 06/10/2020   AGRATIO 1.4 06/10/2020   BILITOT 0.3 06/10/2020   ALKPHOS 106 06/10/2020   AST 19 06/10/2020   ALT 20 06/10/2020   Last lipids Lab Results  Component Value Date   CHOL 218 (H) 06/10/2020   HDL 59 06/10/2020   LDLCALC 142 (H) 06/10/2020   TRIG 97 06/10/2020   CHOLHDL 3.7 06/10/2020   Last hemoglobin A1c No results found for: HGBA1C Last thyroid functions Lab Results  Component Value Date   TSH 3.960 06/10/2020   Last vitamin D No results found for: 25OHVITD2, 25OHVITD3, VD25OH Last vitamin B12 and Folate No results found for: VITAMINB12, FOLATE    Objective    BP 121/83 (BP Location: Right Arm, Patient Position: Sitting, Cuff Size: Large)   Pulse 65   Ht 5' 5.5" (1.664 m)   Wt 183 lb 11.2 oz (83.3 kg)   SpO2 97%   BMI 30.10 kg/m  BP Readings from Last 3 Encounters:  05/30/21 121/83  06/14/20 127/71   05/27/20 137/81   Wt Readings from Last 3 Encounters: Wt Readings from Last 3 Encounters:  05/30/21 183 lb 11.2 oz (83.3 kg)  06/14/20 184 lb 1.6 oz (83.5 kg)  05/27/20 186 lb 14.4 oz (84.8 kg)      Physical Exam Vitals and nursing note reviewed.  Constitutional:      General: She is awake. She is not in acute distress.    Appearance: Normal appearance. She is well-developed and well-groomed. She is obese. She is not ill-appearing, toxic-appearing or diaphoretic.  HENT:     Head: Normocephalic and atraumatic.     Jaw: There is normal jaw occlusion. No trismus, tenderness, swelling or pain on movement.     Right Ear: Hearing, tympanic membrane, ear canal and external ear normal. There is no impacted cerumen.     Left Ear: Hearing, tympanic membrane, ear canal and external ear normal. There is no impacted cerumen.     Nose: Nose normal. No congestion or rhinorrhea.     Right Turbinates: Not enlarged, swollen or pale.     Left Turbinates: Not enlarged, swollen or pale.     Right Sinus: No maxillary sinus tenderness or frontal sinus tenderness.     Left Sinus: No maxillary sinus tenderness or frontal sinus tenderness.     Mouth/Throat:     Lips: Pink.     Mouth: Mucous membranes are moist. No injury.     Tongue: No lesions.     Pharynx: Oropharynx is clear. Uvula midline. No pharyngeal swelling, oropharyngeal exudate, posterior oropharyngeal erythema or uvula swelling.     Tonsils: No tonsillar exudate or tonsillar abscesses.  Eyes:     General: Lids are normal. Lids are everted, no foreign bodies appreciated. Vision grossly intact. Gaze aligned appropriately. No allergic shiner or visual field deficit.       Right eye: No discharge.        Left eye: No discharge.     Extraocular Movements: Extraocular movements intact.     Conjunctiva/sclera: Conjunctivae normal.     Right eye: Right conjunctiva is not injected. No exudate.    Left eye: Left conjunctiva is not injected. No  exudate.    Pupils: Pupils are equal, round, and reactive to light.  Neck:     Thyroid: No thyroid mass, thyromegaly or thyroid tenderness.     Vascular: No carotid bruit.     Trachea: Trachea normal.  Cardiovascular:  Rate and Rhythm: Normal rate and regular rhythm.     Pulses: Normal pulses.          Carotid pulses are 2+ on the right side and 2+ on the left side.      Radial pulses are 2+ on the right side and 2+ on the left side.       Dorsalis pedis pulses are 2+ on the right side and 2+ on the left side.       Posterior tibial pulses are 2+ on the right side and 2+ on the left side.     Heart sounds: Normal heart sounds, S1 normal and S2 normal. No murmur heard.   No friction rub. No gallop.  Pulmonary:     Effort: Pulmonary effort is normal. No respiratory distress.     Breath sounds: Normal breath sounds and air entry. No stridor. No wheezing, rhonchi or rales.  Chest:     Chest wall: No tenderness.     Comments: Breast exam deferred; discussed self exam Abdominal:     General: Abdomen is flat. Bowel sounds are normal. There is no distension.     Palpations: Abdomen is soft. There is no mass.     Tenderness: There is no abdominal tenderness. There is no right CVA tenderness, left CVA tenderness, guarding or rebound.     Hernia: No hernia is present.  Genitourinary:    Comments: Exam deferred; denies complaints Musculoskeletal:        General: No swelling, tenderness, deformity or signs of injury. Normal range of motion.     Cervical back: Full passive range of motion without pain, normal range of motion and neck supple. No edema, rigidity or tenderness. No muscular tenderness.     Right lower leg: No edema.     Left lower leg: No edema.  Lymphadenopathy:     Cervical: No cervical adenopathy.     Right cervical: No superficial, deep or posterior cervical adenopathy.    Left cervical: No superficial, deep or posterior cervical adenopathy.  Skin:    General: Skin is warm  and dry.     Capillary Refill: Capillary refill takes less than 2 seconds.     Coloration: Skin is not jaundiced or pale.     Findings: No bruising, erythema, lesion or rash.  Neurological:     General: No focal deficit present.     Mental Status: She is alert and oriented to person, place, and time. Mental status is at baseline.     GCS: GCS eye subscore is 4. GCS verbal subscore is 5. GCS motor subscore is 6.     Sensory: Sensation is intact. No sensory deficit.     Motor: Motor function is intact. No weakness.     Coordination: Coordination is intact. Coordination normal.     Gait: Gait is intact. Gait normal.  Psychiatric:        Attention and Perception: Attention and perception normal.        Mood and Affect: Mood and affect normal.        Speech: Speech normal.        Behavior: Behavior normal. Behavior is cooperative.        Thought Content: Thought content normal.        Cognition and Memory: Cognition and memory normal.        Judgment: Judgment normal.     Last depression screening scores PHQ 2/9 Scores 05/30/2021 06/14/2020 05/27/2020  PHQ - 2 Score 0 0  0  PHQ- 9 Score 2 0 0   Last fall risk screening Fall Risk  05/30/2021  Falls in the past year? 0  Number falls in past yr: 0  Injury with Fall? 0  Risk for fall due to : No Fall Risks  Follow up -   Last Audit-C alcohol use screening Alcohol Use Disorder Test (AUDIT) 05/30/2021  1. How often do you have a drink containing alcohol? 3  2. How many drinks containing alcohol do you have on a typical day when you are drinking? 0  3. How often do you have six or more drinks on one occasion? 0  AUDIT-C Score 3  4. How often during the last year have you found that you were not able to stop drinking once you had started? -  5. How often during the last year have you failed to do what was normally expected from you because of drinking? -  6. How often during the last year have you needed a first drink in the morning to get  yourself going after a heavy drinking session? -  7. How often during the last year have you had a feeling of guilt of remorse after drinking? -  8. How often during the last year have you been unable to remember what happened the night before because you had been drinking? -  9. Have you or someone else been injured as a result of your drinking? -  10. Has a relative or friend or a doctor or another health worker been concerned about your drinking or suggested you cut down? -  Alcohol Use Disorder Identification Test Final Score (AUDIT) -   A score of 3 or more in women, and 4 or more in men indicates increased risk for alcohol abuse, EXCEPT if all of the points are from question 1   No results found for any visits on 05/30/21.  Assessment & Plan    Routine Health Maintenance and Physical Exam  Exercise Activities and Dietary recommendations  Goals   None     Immunization History  Administered Date(s) Administered   Influenza Split 05/30/2012   Influenza,inj,Quad PF,6+ Mos 05/30/2021   Influenza-Unspecified 07/11/2019   Moderna Sars-Covid-2 Vaccination 05/18/2020   Tdap 04/14/2008    Health Maintenance  Topic Date Due   Zoster Vaccines- Shingrix (1 of 2) Never done   TETANUS/TDAP  04/14/2018   COVID-19 Vaccine (2 - Moderna series) 06/15/2020   PAP SMEAR-Modifier  06/08/2022   MAMMOGRAM  07/22/2022   COLONOSCOPY (Pts 45-65yrs Insurance coverage will need to be confirmed)  05/29/2024   INFLUENZA VACCINE  Completed   Hepatitis C Screening  Completed   HIV Screening  Completed   Pneumococcal Vaccine 53-68 Years old  Aged Out   HPV VACCINES  Aged Out    Discussed health benefits of physical activity, and encouraged her to engage in regular exercise appropriate for her age and condition.  Problem List Items Addressed This Visit       Cardiovascular and Mediastinum   Hot flashes due to menopause    Recommend black cohosh or trial of anti depressants Pt denies at this  time Has tried meds with OB- failed d/t compliance issues        Other   Annual physical exam - Primary    UTD on dental/eye exams Things to do to keep yourself healthy  - Exercise at least 30-45 minutes a day, 3-4 days a week.  - Eat a low-fat diet  with lots of fruits and vegetables, up to 7-9 servings per day.  - Seatbelts can save your life. Wear them always.  - Smoke detectors on every level of your home, check batteries every year.  - Eye Doctor - have an eye exam every 1-2 years  - Safe sex - if you may be exposed to STDs, use a condom.  - Alcohol -  If you drink, do it moderately, less than 2 drinks per day.  - Health Care Power of Attorney. Choose someone to speak for you if you are not able.  - Depression is common in our stressful world.If you're feeling down or losing interest in things you normally enjoy, please come in for a visit.  - Violence - If anyone is threatening or hurting you, please call immediately.        Relevant Orders   Lipid panel   Elevated cholesterol    Repeat lipid panel      Encounter for screening mammogram for malignant neoplasm of breast    External exam location d/t breast implant- due next month      Relevant Orders   MM 3D SCREEN BREAST BILATERAL   Heat intolerance    Check thyroid levels      Relevant Orders   TSH + free T4   Need for shingles vaccine    Rx provided to get at local pharmacy       Relevant Medications   Zoster Vaccine Adjuvanted [facility]) injection   Weight gain    Pt reported weight gain Not obvious on our scales from annual visit alone      Relevant Medications   phentermine 37.5 MG capsule   Need for immunization against influenza    provided      Relevant Orders   Flu Vaccine QUAD 40mo+IM (Fluarix, Fluzone & Alfiuria Quad PF) (Completed)   Weight loss advised    Wishing to lose weight- having to buy larger size clothing      Class 1 obesity due to excess calories without serious comorbidity  with body mass index (BMI) of 30.0 to 30.9 in adult    BMI 30.1 Discussed importance of healthy weight management Discussed diet and exercise Trial of medication- discussed SEs      Relevant Medications   phentermine 37.5 MG capsule     Return in about 3 months (around 08/30/2021), or weight loss.    Leilani Merl, FNP, have reviewed all documentation for this visit. The documentation on 05/30/21 for the exam, diagnosis, procedures, and orders are all accurate and complete.    Jacky Kindle, FNP  [facility] [phone] (phone) [phone] (fax)  [facility]

## 2021-05-30 NOTE — Assessment & Plan Note (Signed)
Recommend black cohosh or trial of anti depressants Pt denies at this time Has tried meds with OB- failed d/t compliance issues

## 2021-05-30 NOTE — Patient Instructions (Signed)
Preventive Care 59-59 Years Oldears Old, Female Preventive care refers to lifestyle choices and visits with your health care provider that can promote health and wellness. Preventive care visits are also called wellness exams. What can I expect for my preventive care visit? Counseling Your health care provider may ask you questions about your: Medical history, including: Past medical problems. Family medical history. Pregnancy history. Current health, including: Menstrual cycle. Method of birth control. Emotional well-being. Home life and relationship well-being. Sexual activity and sexual health. Lifestyle, including: Alcohol, nicotine or tobacco, and drug use. Access to firearms. Diet, exercise, and sleep habits. Work and work Statistician. Sunscreen use. Safety issues such as seatbelt and bike helmet use. Physical exam Your health care provider will check your: Height and weight. These may be used to calculate your BMI (body mass index). BMI is a measurement that tells if you are at a healthy weight. Waist circumference. This measures the distance around your waistline. This measurement also tells if you are at a healthy weight and may help predict your risk of certain diseases, such as type 2 diabetes and high blood pressure. Heart rate and blood pressure. Body temperature. Skin for abnormal spots. What immunizations do I need? Vaccines are usually given at various ages, according to a schedule. Your health care provider will recommend vaccines for you based on your age, medical history, and lifestyle or other factors, such as travel or where you work. What tests do I need? Screening Your health care provider may recommend screening tests for certain conditions. This may include: Lipid and cholesterol levels. Diabetes screening. This is done by checking your blood sugar (glucose) after you have not eaten for a while (fasting). Pelvic exam and Pap test. Hepatitis B test. Hepatitis C  test. HIV (human immunodeficiency virus) test. STI (sexually transmitted infection) testing, if you are at risk. Lung cancer screening. Colorectal cancer screening. Mammogram. Talk with your health care provider about when you should start having regular mammograms. This may depend on whether you have a family history of breast cancer. BRCA-related cancer screening. This may be done if you have a family history of breast, ovarian, tubal, or peritoneal cancers. Bone density scan. This is done to screen for osteoporosis. Talk with your health care provider about your test results, treatment options, and if necessary, the need for more tests. Follow these instructions at home: Eating and drinking  Eat a diet that includes fresh fruits and vegetables, whole grains, lean protein, and low-fat dairy products. Take vitamin and mineral supplements as recommended by your health care provider. Do not drink alcohol if: Your health care provider tells you not to drink. You are pregnant, may be pregnant, or are planning to become pregnant. If you drink alcohol: Limit how much you have to 0-1 drink a day. Know how much alcohol is in your drink. In the [place] one drink equals one 12 oz bottle of beer (355 mL), one 5 oz glass of wine (148 mL), or one 1 oz glass of hard liquor (44 mL). Lifestyle Brush your teeth every morning and night with fluoride toothpaste. Floss one time each day. Exercise for at least 30 minutes 5 or more days each week. Do not use any products that contain nicotine or tobacco. These products include cigarettes, chewing tobacco, and vaping devices, such as e-cigarettes. If you need help quitting, ask your health care provider. Do not use drugs. If you are sexually active, practice safe sex. Use a condom or other form of protection to prevent  STIs. If you do not wish to become pregnant, use a form of birth control. If you plan to become pregnant, see your health care provider for a  prepregnancy visit. Take aspirin only as told by your health care provider. Make sure that you understand how much to take and what form to take. Work with your health care provider to find out whether it is safe and beneficial for you to take aspirin daily. Find healthy ways to manage stress, such as: Meditation, yoga, or listening to music. Journaling. Talking to a trusted person. Spending time with friends and family. Minimize exposure to UV radiation to reduce your risk of skin cancer. Safety Always wear your seat belt while driving or riding in a vehicle. Do not drive: If you have been drinking alcohol. Do not ride with someone who has been drinking. When you are tired or distracted. While texting. If you have been using any mind-altering substances or drugs. Wear a helmet and other protective equipment during sports activities. If you have firearms in your house, make sure you follow all gun safety procedures. Seek help if you have been physically or sexually abused. What's next? Visit your health care provider once a year for an annual wellness visit. Ask your health care provider how often you should have your eyes and teeth checked. Stay up to date on all vaccines. This information is not intended to replace advice given to you by your health care provider. Make sure you discuss any questions you have with your health care provider. Document Revised: 01/05/2021 Document Reviewed: 01/05/2021 Elsevier Patient Education  [employer].

## 2021-05-30 NOTE — Assessment & Plan Note (Signed)
provided ?

## 2021-07-21 ENCOUNTER — Encounter: Payer: Self-pay | Admitting: Family Medicine

## 2021-08-31 NOTE — Progress Notes (Signed)
Established patient visit   Patient: Kristi Shepherd   DOB: 02/20/1961   61 y.o. Female  MRN: [id number] Visit Date: 09/01/2021  Today's healthcare provider: Jacky Kindle, FNP   No chief complaint on file.  Subjective    HPI  Patient is a 61 year old female who presents for follow up of weight loss management.   Patient has been taking the Phentermine and states the only side effects are the racing of her heart and insomnia.  Patient has needed to start on her allergy medication and while taking this she has stopped the Phentermine.  She has been off of them for 2 weeks now.  Medications: Outpatient Medications Prior to Visit  Medication Sig   meloxicam (MOBIC) 15 MG tablet Take 15 mg by mouth daily.   MULTIPLE VITAMIN PO Take by mouth.   phentermine 37.5 MG capsule Take 1 capsule (37.5 mg total) by mouth every morning.   No facility-administered medications prior to visit.    Review of Systems  Respiratory:  Negative for shortness of breath.   Cardiovascular:  Negative for chest pain, palpitations and leg swelling.  Neurological:  Negative for dizziness and headaches.      Objective    BP 126/88 (BP Location: Right Arm, Patient Position: Sitting, Cuff Size: Normal)    Pulse 74    Temp 98.9 F (37.2 C) (Oral)    Wt 180 lb (81.6 kg)    SpO2 97%    BMI 29.50 kg/m  Vitals:   09/01/21 0821 09/01/21 0840  BP: (!) 123/96 126/88  Pulse: 74   Temp: 98.9 F (37.2 C)   TempSrc: Oral   SpO2: 97%   Weight: 180 lb (81.6 kg)      Physical Exam Vitals and nursing note reviewed.  Constitutional:      General: She is not in acute distress.    Appearance: Normal appearance. She is overweight. She is not ill-appearing, toxic-appearing or diaphoretic.  HENT:     Head: Normocephalic and atraumatic.  Cardiovascular:     Rate and Rhythm: Normal rate and regular rhythm.     Pulses: Normal pulses.     Heart sounds: Normal heart sounds. No murmur heard.   No friction rub.  No gallop.  Pulmonary:     Effort: Pulmonary effort is normal. No respiratory distress.     Breath sounds: Normal breath sounds. No stridor. No wheezing, rhonchi or rales.  Chest:     Chest wall: No tenderness.  Abdominal:     General: Bowel sounds are normal.     Palpations: Abdomen is soft.  Musculoskeletal:        General: No swelling, tenderness, deformity or signs of injury. Normal range of motion.     Right lower leg: No edema.     Left lower leg: No edema.  Skin:    General: Skin is warm and dry.     Capillary Refill: Capillary refill takes less than 2 seconds.     Coloration: Skin is not jaundiced or pale.     Findings: No bruising, erythema, lesion or rash.  Neurological:     General: No focal deficit present.     Mental Status: She is alert and oriented to person, place, and time. Mental status is at baseline.     Cranial Nerves: No cranial nerve deficit.     Sensory: No sensory deficit.     Motor: No weakness.     Coordination: Coordination  normal.  Psychiatric:        Mood and Affect: Mood normal.        Behavior: Behavior normal.        Thought Content: Thought content normal.        Judgment: Judgment normal.     No results found for any visits on 09/01/21.  Assessment & Plan     Problem List Items Addressed This Visit       Cardiovascular and Mediastinum   Hot flashes due to menopause    Plans to f/u up with 'hormone doctor' her daughter sees Previous on medication, off d/t poor compliance Wishes to re-establish given lack of weight loss Refused additional need for referral at this time        Endocrine   Cyst of ovary    Chronic, f/b GYN Previous referred, has not followed up for surgical removal Encourage follow up for removal given associated hormonal symptoms        Other   Overweight (BMI 25.0-29.9) - Primary    Slight weight loss noted with medication assistance, stopped d/t SEs Encouraged plant based, ACLM information  provided Encouraged exercise to augment        No follow-ups on file.      Vivien Rota DeSanto,acting as a scribe for Jacky Kindle, FNP.,have documented all relevant documentation on the behalf of Jacky Kindle, FNP,as directed by  Jacky Kindle, FNP while in the presence of Jacky Kindle, FNP.  Leilani Merl, FNP, have reviewed all documentation for this visit. The documentation on 09/01/21 for the exam, diagnosis, procedures, and orders are all accurate and complete.    Jacky Kindle, FNP  [facility] [phone] (phone) [phone] (fax)  [facility]

## 2021-09-01 ENCOUNTER — Ambulatory Visit (INDEPENDENT_AMBULATORY_CARE_PROVIDER_SITE_OTHER): Payer: BC Managed Care – PPO | Admitting: Family Medicine

## 2021-09-01 ENCOUNTER — Encounter: Payer: Self-pay | Admitting: Family Medicine

## 2021-09-01 ENCOUNTER — Other Ambulatory Visit: Payer: Self-pay

## 2021-09-01 VITALS — BP 126/88 | HR 74 | Temp 98.9°F | Wt 180.0 lb

## 2021-09-01 DIAGNOSIS — N951 Menopausal and female climacteric states: Secondary | ICD-10-CM | POA: Diagnosis not present

## 2021-09-01 DIAGNOSIS — E663 Overweight: Secondary | ICD-10-CM | POA: Diagnosis not present

## 2021-09-01 DIAGNOSIS — N83209 Unspecified ovarian cyst, unspecified side: Secondary | ICD-10-CM

## 2021-09-01 NOTE — Assessment & Plan Note (Signed)
Slight weight loss noted with medication assistance, stopped d/t SEs Encouraged plant based, ACLM information provided Encouraged exercise to augment

## 2021-09-01 NOTE — Assessment & Plan Note (Signed)
Chronic, f/b GYN Previous referred, has not followed up for surgical removal Encourage follow up for removal given associated hormonal symptoms

## 2021-09-01 NOTE — Assessment & Plan Note (Signed)
Plans to f/u up with 'hormone doctor' her daughter sees Previous on medication, off d/t poor compliance Wishes to re-establish given lack of weight loss Refused additional need for referral at this time

## 2021-09-02 LAB — LIPID PANEL
Chol/HDL Ratio: 3.8 ratio (ref 0.0–4.4)
Cholesterol, Total: 206 mg/dL — ABNORMAL HIGH (ref 100–199)
HDL: 54 mg/dL (ref >39)
LDL Chol Calc (NIH): 138 mg/dL — ABNORMAL HIGH (ref 0–99)
Triglycerides: 77 mg/dL (ref 0–149)
VLDL Cholesterol Cal: 14 mg/dL (ref 5–40)

## 2021-09-02 LAB — TSH+FREE T4
Free T4: 1.07 ng/dL (ref 0.82–1.77)
TSH: 3.11 u[IU]/mL (ref 0.450–4.500)

## 2021-11-09 ENCOUNTER — Other Ambulatory Visit: Payer: Self-pay | Admitting: Family Medicine

## 2022-05-17 ENCOUNTER — Other Ambulatory Visit: Payer: Self-pay | Admitting: Adult Health

## 2022-05-17 DIAGNOSIS — Z1231 Encounter for screening mammogram for malignant neoplasm of breast: Secondary | ICD-10-CM

## 2022-05-31 ENCOUNTER — Encounter: Payer: BC Managed Care – PPO | Admitting: Family Medicine

## 2023-06-05 LAB — HM PAP SMEAR: HM Pap smear: NEGATIVE

## 2023-06-14 LAB — HM PAP SMEAR
HM Pap smear: NEGATIVE
HPV, high-risk: NEGATIVE

## 2023-07-12 ENCOUNTER — Other Ambulatory Visit (HOSPITAL_COMMUNITY)
Admission: RE | Admit: 2023-07-12 | Discharge: 2023-07-12 | Disposition: A | Discharge status: Home / Self Care | Payer: BC Managed Care – PPO | Source: Ambulatory Visit | Attending: Obstetrics and Gynecology | Admitting: Obstetrics and Gynecology

## 2023-07-12 ENCOUNTER — Ambulatory Visit: Payer: BC Managed Care – PPO | Admitting: Obstetrics and Gynecology

## 2023-07-12 VITALS — BP 115/72 | HR 68 | Resp 16 | Ht 65.5 in | Wt 163.8 lb

## 2023-07-12 DIAGNOSIS — N888 Other specified noninflammatory disorders of cervix uteri: Secondary | ICD-10-CM | POA: Insufficient documentation

## 2023-07-12 DIAGNOSIS — D259 Leiomyoma of uterus, unspecified: Secondary | ICD-10-CM | POA: Diagnosis not present

## 2023-07-12 NOTE — Progress Notes (Signed)
GYNECOLOGY PROGRESS NOTE  Subjective:    Patient ID: Kristi Shepherd, female    DOB: April 06, 1961, 62 y.o.   MRN: [id number]  HPI  Patient is a 62 y.o. female who presents for consultation for cyst removal. She reports that she had a pap smear done at [facility] and her provider noted that she had a "cyst on her uterus".  Patient notes that she has had this "cyst" for several years. They encouraged that she needed to have this removed and referred her to a GYN. She denies vaginal bleeding, pain,    No past medical history on file.  Past Surgical History:  Procedure Laterality Date   TUBAL LIGATION      Family History  Problem Relation Age of Onset   Thyroid disease Mother    Leukemia Father     Social History   Socioeconomic History   Marital status: Married    Spouse name: Not on file   Number of children: Not on file   Years of education: Not on file   Highest education level: Not on file  Occupational History   Not on file  Tobacco Use   Smoking status: Former   Smokeless tobacco: Never   Tobacco comments:    Quit at age 28  Substance and Sexual Activity   Alcohol use: Yes    Comment: Ocassional alcohol use   Drug use: No   Sexual activity: Not on file  Other Topics Concern   Not on file  Social History Narrative   Not on file   Social Drivers of Health   Financial Resource Strain: Not on file  Food Insecurity: Not on file  Transportation Needs: Not on file  Physical Activity: Not on file  Stress: Not on file  Social Connections: Not on file  Intimate Partner Violence: Not on file    Current Outpatient Medications on File Prior to Visit  Medication Sig Dispense Refill   MULTIPLE VITAMIN PO Take by mouth.     No current facility-administered medications on file prior to visit.     Allergies  Allergen Reactions   Codeine     Other reaction(s): Itching     Review of Systems Pertinent items noted in HPI and remainder of comprehensive  ROS otherwise negative.   Objective:   Blood pressure 115/72, pulse 68, resp. rate 16, height 5' 5.5" (1.664 m), weight 163 lb 12.8 oz (74.3 kg). Body mass index is 26.84 kg/m. General appearance: alert, cooperative, and no distress Abdomen: soft, non-tender; bowel sounds normal; no masses,  no organomegaly Pelvic: external genitalia normal, rectovaginal septum normal.  Vagina without discharge.  Cervix with flesh-colored mass protruding to cervical os, l.normal appearing, no lesions and no motion tenderness.  Uterus mobile, nontender, normal shape and size.  Adnexae non-palpable, nontender bilaterally.  Extremities: extremities normal, atraumatic, no cyanosis or edema Neurologic: Grossly normal   Assessment:   1. Cervical mass      Plan:   - Advised that the mass was not  a cyst based on today's exam, Likely an aborting fibroid (suspected) vs cervical polyp. Removal performed today. Will notify patient of results via Mychart.  No further follow up or intervention warranted if region was benign.    MYOMECTOMY PROCEDURE  NOTE The indications for cervical biopsy (removal of mass) were discussed.  Advised that this could performed in the office or in the hospital. . Has no risk actors such as  bleeding were reviewed.   Risks  of the biopsy including pain, bleeding, infection, inadequate specimen, scarring and need for additional procedures  were discussed. The patient stated understanding and agreed to undergo procedure today. Consent was signed,  time out performed.  Chaperone was present during entire procedure. Th anterior lip of the cervix was grasped using a tenaculum. The cervical mass was grasped using Boseman forceps, and twisted at the base until the stalk was disconnected.  Minimal bleeding encountered.  Patient tolerated the procedure well.     Hildred Laser, MD [facility]

## 2023-07-14 ENCOUNTER — Encounter: Payer: Self-pay | Admitting: Obstetrics and Gynecology

## 2023-07-17 LAB — SURGICAL PATHOLOGY

## 2023-09-05 ENCOUNTER — Encounter: Payer: Self-pay | Admitting: Obstetrics and Gynecology
